# Patient Record
Sex: Male | Born: 1965 | Race: White | Hispanic: No | Marital: Single | Smoking: Never smoker
Health system: Southern US, Community
[De-identification: ages and names within clinical notes are randomized; demographics above are authoritative.]

## PROBLEM LIST (undated history)

## (undated) DIAGNOSIS — F419 Anxiety disorder, unspecified: Secondary | ICD-10-CM

## (undated) DIAGNOSIS — Q909 Down syndrome, unspecified: Secondary | ICD-10-CM

## (undated) DIAGNOSIS — M21612 Bunion of left foot: Secondary | ICD-10-CM

## (undated) HISTORY — PX: TOE SURGERY: SHX1073

## (undated) HISTORY — DX: Down syndrome, unspecified: Q90.9

---

## 1998-10-06 ENCOUNTER — Emergency Department (HOSPITAL_COMMUNITY): Admission: EM | Admit: 1998-10-06 | Discharge: 1998-10-07 | Payer: Self-pay | Admitting: Emergency Medicine

## 2001-03-14 ENCOUNTER — Emergency Department (HOSPITAL_COMMUNITY): Admission: EM | Admit: 2001-03-14 | Discharge: 2001-03-14 | Payer: Self-pay | Admitting: Emergency Medicine

## 2001-03-14 ENCOUNTER — Encounter: Payer: Self-pay | Admitting: Emergency Medicine

## 2001-12-08 ENCOUNTER — Emergency Department (HOSPITAL_COMMUNITY): Admission: EM | Admit: 2001-12-08 | Discharge: 2001-12-08 | Payer: Self-pay | Admitting: Emergency Medicine

## 2011-09-17 ENCOUNTER — Ambulatory Visit (INDEPENDENT_AMBULATORY_CARE_PROVIDER_SITE_OTHER): Payer: Medicare Other | Admitting: Family Medicine

## 2011-09-17 VITALS — BP 106/68 | HR 76 | Temp 98.6°F | Resp 18 | Ht 64.0 in | Wt 132.0 lb

## 2011-09-17 DIAGNOSIS — L259 Unspecified contact dermatitis, unspecified cause: Secondary | ICD-10-CM

## 2011-09-17 DIAGNOSIS — L309 Dermatitis, unspecified: Secondary | ICD-10-CM

## 2011-09-17 DIAGNOSIS — E039 Hypothyroidism, unspecified: Secondary | ICD-10-CM

## 2011-09-17 LAB — TSH: TSH: 2.882 u[IU]/mL (ref 0.350–4.500)

## 2011-09-17 MED ORDER — LEVOTHYROXINE SODIUM 50 MCG PO TABS: 50.0000 ug | ORAL_TABLET | Freq: Every day | ORAL | Status: DC

## 2011-09-17 NOTE — Patient Instructions (Addendum)
Continue thyroid medication. Return in 3 months for me to recheck it.  Use OTC hydrocortisone cream on face once daily for a few days until redness, down.

## 2011-09-17 NOTE — Progress Notes (Signed)
Subjective: Patient is here because he is out of his thyroid. He stopped it because he ran out. He did not come in because he scared come to doctors because he gets stuck. He has Down syndrome. He functions fairly well. I  Review of systems fairly unremarkable he does have a rash on his face. Assessment 1 for constipation. He does have a queasy stomach. He is anxious a lot of the time. He continues to see the psychiatrist and is on medications for that.  Objective: 46 year old man in no acute distress. Face is red. Special in the cheeks, not typically the pattern of rosacea. It looks more like an eczema. His neck is supple without nodes thyromegaly. Chest clear. Heart regular without murmurs. No ankle edema. He can reflexes essentially absent.  Assessment: Hypothyroidism Down syndrome Anxiety  Plan: Refill his medication. Recheck labs in about or months. Thank you

## 2011-09-21 ENCOUNTER — Encounter: Payer: Self-pay | Admitting: Radiology

## 2012-02-16 ENCOUNTER — Ambulatory Visit (INDEPENDENT_AMBULATORY_CARE_PROVIDER_SITE_OTHER): Payer: Medicare Other | Admitting: Family Medicine

## 2012-02-16 VITALS — BP 93/59 | HR 67 | Temp 97.4°F | Resp 16 | Ht 64.0 in | Wt 121.0 lb

## 2012-02-16 DIAGNOSIS — Q909 Down syndrome, unspecified: Secondary | ICD-10-CM

## 2012-02-16 DIAGNOSIS — E7800 Pure hypercholesterolemia, unspecified: Secondary | ICD-10-CM

## 2012-02-16 DIAGNOSIS — H6120 Impacted cerumen, unspecified ear: Secondary | ICD-10-CM

## 2012-02-16 DIAGNOSIS — F419 Anxiety disorder, unspecified: Secondary | ICD-10-CM

## 2012-02-16 DIAGNOSIS — Z8639 Personal history of other endocrine, nutritional and metabolic disease: Secondary | ICD-10-CM

## 2012-02-16 DIAGNOSIS — E039 Hypothyroidism, unspecified: Secondary | ICD-10-CM

## 2012-02-16 DIAGNOSIS — F411 Generalized anxiety disorder: Secondary | ICD-10-CM

## 2012-02-16 NOTE — Patient Instructions (Signed)
Use debrox for a week or so before returning to work on the other ear.

## 2012-02-16 NOTE — Progress Notes (Signed)
Subjective: 47 rolled male in no major distress at this time. He is here for recheck with regard to his hypothyroidism and refill of his medication. He has a lot of dry skin eczema on his hands which doesn't seem to really bother him that much, but also on his face. He has a history of his ears requiring irrigation in the past, and is not been complaining of his ears right now. He has been healthy this where so far. He did get his flu shot. On review of systems HEENT was negative. He denies any cardiovascular or respiratory GI or GU symptoms.  Objective: Pleasant young man in no major distress. TMs are both occluded. Has large plugs of wax bilaterally. His throat is clear. Neck supple without significant nodes. Chest is clear to auscultation. Heart regular without murmurs. Abdomen soft without mass or tenderness.  Assessment: Hypothyroidism Down Syndrome Dry skin eczema Bilateral cerumen impaction  Plan: We'll try to see if we can get him cleaned out there.  Procedure: With great effort we were able to irrigate and curette a large plug of wax out of his left ear. He is able to hear from it now. However the right ear remains occluded, and the wax did not easily come loose. I decided to wait and have him use the proximal and try again at a future time.

## 2012-02-17 LAB — LIPID PANEL
Cholesterol: 161 mg/dL (ref 0–200)
HDL: 46 mg/dL (ref >39)
LDL Cholesterol: 85 mg/dL (ref 0–99)
Total CHOL/HDL Ratio: 3.5 Ratio
Triglycerides: 149 mg/dL (ref <150)
VLDL: 30 mg/dL (ref 0–40)

## 2012-02-17 LAB — COMPREHENSIVE METABOLIC PANEL
ALT: 29 U/L (ref 0–53)
AST: 21 U/L (ref 0–37)
Albumin: 4.4 g/dL (ref 3.5–5.2)
Alkaline Phosphatase: 73 U/L (ref 39–117)
BUN: 12 mg/dL (ref 6–23)
CO2: 31 mEq/L (ref 19–32)
Calcium: 9.6 mg/dL (ref 8.4–10.5)
Chloride: 105 mEq/L (ref 96–112)
Creat: 1.06 mg/dL (ref 0.50–1.35)
Glucose, Bld: 86 mg/dL (ref 70–99)
Potassium: 4.2 mEq/L (ref 3.5–5.3)
Sodium: 143 mEq/L (ref 135–145)
Total Bilirubin: 0.3 mg/dL (ref 0.3–1.2)
Total Protein: 6.8 g/dL (ref 6.0–8.3)

## 2012-02-17 LAB — TSH: TSH: 1.264 u[IU]/mL (ref 0.350–4.500)

## 2012-03-19 ENCOUNTER — Other Ambulatory Visit: Payer: Self-pay | Admitting: Family Medicine

## 2012-06-20 ENCOUNTER — Other Ambulatory Visit: Payer: Self-pay | Admitting: Family Medicine

## 2012-08-20 ENCOUNTER — Other Ambulatory Visit: Payer: Self-pay | Admitting: Family Medicine

## 2014-08-19 ENCOUNTER — Encounter (HOSPITAL_COMMUNITY): Payer: Self-pay | Admitting: Emergency Medicine

## 2014-08-19 ENCOUNTER — Emergency Department (INDEPENDENT_AMBULATORY_CARE_PROVIDER_SITE_OTHER): Payer: Medicare Other

## 2014-08-19 ENCOUNTER — Emergency Department (HOSPITAL_COMMUNITY)
Admission: EM | Admit: 2014-08-19 | Discharge: 2014-08-19 | Disposition: A | Discharge status: Home / Self Care | Payer: Medicare Other | Source: Home / Self Care | Attending: Family Medicine | Admitting: Family Medicine

## 2014-08-19 DIAGNOSIS — S63501A Unspecified sprain of right wrist, initial encounter: Secondary | ICD-10-CM

## 2014-08-19 DIAGNOSIS — M25531 Pain in right wrist: Secondary | ICD-10-CM

## 2014-08-19 NOTE — Discharge Instructions (Signed)
Joint Sprain A sprain is a tear or stretch in the ligaments that hold a joint together. Severe sprains may need as long as 3-6 weeks of immobilization and/or exercises to heal completely. Sprained joints should be rested and protected. If not, they can become unstable and prone to re-injury. Proper treatment can reduce your pain, shorten the period of disability, and reduce the risk of repeated injuries. TREATMENT   Rest and elevate the injured joint to reduce pain and swelling.  Apply ice packs to the injury for 20-30 minutes every 2-3 hours for the next 2-3 days.  Keep the injury wrapped in a compression bandage or splint as long as the joint is painful or as instructed by your caregiver.  Do not use the injured joint until it is completely healed to prevent re-injury and chronic instability. Follow the instructions of your caregiver.  Long-term sprain management may require exercises and/or treatment by a physical therapist. Taping or special braces may help stabilize the joint until it is completely better. SEEK MEDICAL CARE IF:   You develop increased pain or swelling of the joint.  You develop increasing redness and warmth of the joint.  You develop a fever.  It becomes stiff.  Your hand or foot gets cold or numb. Document Released: 02/27/2004 Document Revised: 04/13/2011 Document Reviewed: 02/06/2008 [facility] Patient Information 2015 [employer] [place]. This information is not intended to replace advice given to you by your health care provider. Make sure you discuss any questions you have with your health care provider.   No fractures are noted. Ice to area every hour x 2o minutes for the next 24 hours to help with swelling and pain. Use Tylenol or Motrin as needed for discomfort.

## 2014-08-19 NOTE — ED Provider Notes (Signed)
CSN: [id number]     Arrival date & time 08/19/14  1904 History   First MD Initiated Contact with Patient 08/19/14 1915     Chief Complaint  Patient presents with  . Wrist Injury   (Consider location/radiation/quality/duration/timing/severity/associated sxs/prior Treatment) HPI Comments: Mr. Whitney PostLogan is brought in by his brother today; who is patient's primary care giver. Sharlot GowdaGregg has DS. He fell yesterday while at his sister's home. He landed on the right wrist in a extended position. Pain along the right wrist and 2nd and 3rd MCP. Swelling is noted. Brother reports will not move.   Patient is a 49 y.o. male presenting with wrist injury. The history is provided by a caregiver. The history is limited by a developmental delay.  Wrist Injury   Past Medical History  Diagnosis Date  . Depression   . Down syndrome    Past Surgical History  Procedure Laterality Date  . Toe surgery     Family History  Problem Relation Age of Onset  . Hypothyroidism Mother   . Heart disease Father   . Liver disease Father   . Hypothyroidism Sister   . Depression Brother   . GER disease Brother   . Heart disease Maternal Grandmother   . Heart disease Maternal Grandfather    History  Substance Use Topics  . Smoking status: Never Smoker   . Smokeless tobacco: Not on file  . Alcohol Use: No    Review of Systems  All other systems reviewed and are negative.   Allergies  Vicodin  Home Medications   Prior to Admission medications   Medication Sig Start Date End Date Taking? Authorizing Provider  ALPRAZolam Prudy Feeler(XANAX) 0.5 MG tablet Take 0.5 mg by mouth at bedtime as needed.    Historical Provider, MD  desvenlafaxine (PRISTIQ) 100 MG 24 hr tablet Take 100 mg by mouth daily.    Historical Provider, MD  levothyroxine (SYNTHROID, LEVOTHROID) 50 MCG tablet TAKE 1 TABLET BY MOUTH EVERY DAY 08/20/12   Eleanore E Egan, PA-C   BP 155/79 mmHg  Pulse 61  Temp(Src) 98.2 F (36.8 C) (Oral)  Resp 20  SpO2  100% Physical Exam  Constitutional: He appears well-developed and well-nourished. No distress.  Musculoskeletal:  Guarding to right wrist and hand. Difficult to fully asses.  Swelling in the right wrist and along the right 2nd and 3rd MCP; tenderness to any palpation. Refused to produce ROM. Pulses intact, good color and warm to touch.   Neurological: He is alert.  Skin: Skin is warm and dry. He is not diaphoretic.  Psychiatric: His behavior is normal.  Nursing note and vitals reviewed.   ED Course  Procedures (including critical care time) Labs Review Labs Reviewed - No data to display  Imaging Review Dg Wrist Complete Right  08/19/2014   CLINICAL DATA:  49 year old male with fall and wrist pain  EXAM: RIGHT WRIST - COMPLETE 3+ VIEW; RIGHT HAND - COMPLETE 3+ VIEW  COMPARISON:  None.  FINDINGS: There is no evidence of fracture or dislocation. There is no evidence of arthropathy or other focal bone abnormality. Soft tissues are unremarkable.  IMPRESSION: No fracture.   Electronically Signed   By: Elgie CollardArash  Radparvar M.D.   On: 08/19/2014 20:07   Dg Hand Complete Right  08/19/2014   CLINICAL DATA:  49 year old male with fall and wrist pain  EXAM: RIGHT WRIST - COMPLETE 3+ VIEW; RIGHT HAND - COMPLETE 3+ VIEW  COMPARISON:  None.  FINDINGS: There is no evidence of fracture  or dislocation. There is no evidence of arthropathy or other focal bone abnormality. Soft tissues are unremarkable.  IMPRESSION: No fracture.   Electronically Signed   By: Elgie Collard M.D.   On: 08/19/2014 20:07     MDM   1. Wrist sprain, right, initial encounter   2. Wrist pain, acute, right    No fracture. Sprain and soft tissue swelling. Treat with brace x 2 weeks. Ice, NSAIDs and rest. If worsens will f/u with Ortho.    Riki Sheer, PA-C 08/19/14 2015

## 2014-08-19 NOTE — ED Notes (Signed)
Caregiver brings pt in b/c he fell yest and landed on concrete flooring inj his right wrist Sx include swelling and pain Pt has hx of depression and down syndrome Alert, no signs of acute distress.

## 2015-12-09 ENCOUNTER — Ambulatory Visit (INDEPENDENT_AMBULATORY_CARE_PROVIDER_SITE_OTHER): Payer: Medicare Other | Admitting: Family Medicine

## 2015-12-09 VITALS — BP 124/80 | HR 57 | Temp 97.7°F | Resp 16 | Ht 63.0 in | Wt 125.0 lb

## 2015-12-09 DIAGNOSIS — S0003XA Contusion of scalp, initial encounter: Secondary | ICD-10-CM | POA: Diagnosis not present

## 2015-12-09 DIAGNOSIS — G44319 Acute post-traumatic headache, not intractable: Secondary | ICD-10-CM | POA: Diagnosis not present

## 2015-12-09 DIAGNOSIS — W19XXXA Unspecified fall, initial encounter: Secondary | ICD-10-CM

## 2015-12-09 DIAGNOSIS — Y92009 Unspecified place in unspecified non-institutional (private) residence as the place of occurrence of the external cause: Principal | ICD-10-CM

## 2015-12-09 DIAGNOSIS — Y92099 Unspecified place in other non-institutional residence as the place of occurrence of the external cause: Secondary | ICD-10-CM | POA: Diagnosis not present

## 2015-12-09 NOTE — Progress Notes (Signed)
  Chief Complaint  Patient presents with  . head pain    x 2 days     HPI Patient is here with his brother who he lives with He tripped over his shoe and fell Wednesday 5 days ago He started complaining about head pain 2 days ago He was mostly complaining of headache in the right parietal area. Tylenol does not make it stop hurting He denies blacking out No nausea or vomiting He is eating and back to his normal routine.   Past Medical History:  Diagnosis Date  . Depression   . Down syndrome     Current Outpatient Prescriptions  Medication Sig Dispense Refill  . ALPRAZolam (XANAX) 0.5 MG tablet Take 0.5 mg by mouth at bedtime as needed.     No current facility-administered medications for this visit.     Allergies:  Allergies  Allergen Reactions  . Vicodin [Hydrocodone-Acetaminophen] Itching and Anxiety    Past Surgical History:  Procedure Laterality Date  . TOE SURGERY      Social History   Social History  . Marital status: Single    Spouse name: N/A  . Number of children: N/A  . Years of education: N/A   Social History Main Topics  . Smoking status: Never Smoker  . Smokeless tobacco: Never Used  . Alcohol use No  . Drug use: No  . Sexual activity: No   Other Topics Concern  . None   Social History Narrative  . None    ROS  Objective: Vitals:   12/09/15 1220  BP: 124/80  Pulse: (!) 57  Resp: 16  Temp: 97.7 F (36.5 C)  TempSrc: Oral  SpO2: 100%  Weight: 125 lb (56.7 kg)  Height: 5\' 3"  (1.6 m)    Physical Exam  Constitutional: He appears well-developed and well-nourished.  HENT:  Head: Atraumatic.  Right Ear: External ear normal.  Left Ear: External ear normal.  Tenderness to palpation of the scalp in the right parietal area No ecchymosis No laceration   Eyes: Conjunctivae and EOM are normal.  Pulmonary/Chest: Effort normal.  Neurological: He displays normal reflexes.  Strength 5/5 in all extremities    Assessment and  Plan Sharlot GowdaGregg was seen today for head pain.  Diagnoses and all orders for this visit:  Fall in home, initial encounter Acute post-traumatic headache, not intractable Contusion of scalp, initial encounter -  Advised to use motrin for pain -  Ice prn -  Offered reassurance    Faryn Sieg A Creta LevinStallings

## 2015-12-09 NOTE — Patient Instructions (Addendum)
     IF you received an x-ray today, you will receive an invoice from [facility]. Please contact [facility] at [phone] with questions or concerns regarding your invoice.   IF you received labwork today, you will receive an invoice from [facility]. Please contact [facility] at [phone] with questions or concerns regarding your invoice.   Our billing staff will not be able to assist you with questions regarding bills from these companies.  You will be contacted with the lab results as soon as they are available. The fastest way to get your results is to activate your My Chart account. Instructions are located on the last page of this paperwork. If you have not heard from us regarding the results in 2 weeks, please contact this office.     Facial or Scalp Contusion A facial or scalp contusion is a deep bruise on the face or head. Injuries to the face and head generally cause a lot of swelling, especially around the eyes. Contusions are the result of an injury that caused bleeding under the skin. The contusion may turn blue, purple, or yellow. Minor injuries will give you a painless contusion, but more severe contusions may stay painful and swollen for a few weeks.  CAUSES  A facial or scalp contusion is caused by a blunt injury or trauma to the face or head area.  SIGNS AND SYMPTOMS   Swelling of the injured area.   Discoloration of the injured area.   Tenderness, soreness, or pain in the injured area.  DIAGNOSIS  The diagnosis can be made by taking a medical history and doing a physical exam. An X-ray exam, CT scan, or MRI may be needed to determine if there are any associated injuries, such as broken bones (fractures). TREATMENT  Often, the best treatment for a facial or scalp contusion is applying cold compresses to the injured area. Over-the-counter medicines may also be recommended for pain control.  HOME CARE INSTRUCTIONS    Only take over-the-counter or prescription medicines as directed by your health care provider.   Apply ice to the injured area.   Put ice in a plastic bag.   Place a towel between your skin and the bag.   Leave the ice on for 20 minutes, 2-3 times a day.  SEEK MEDICAL CARE IF:  You have bite problems.   You have pain with chewing.   You are concerned about facial defects. SEEK IMMEDIATE MEDICAL CARE IF:  You have severe pain or a headache that is not relieved by medicine.   You have unusual sleepiness, confusion, or personality changes.   You throw up (vomit).   You have a persistent nosebleed.   You have double vision or blurred vision.   You have fluid drainage from your nose or ear.   You have difficulty walking or using your arms or legs.  MAKE SURE YOU:   Understand these instructions.  Will watch your condition.  Will get help right away if you are not doing well or get worse.   This information is not intended to replace advice given to you by your health care provider. Make sure you discuss any questions you have with your health care provider.   Document Released: 02/27/2004 Document Revised: 02/09/2014 Document Reviewed: 09/01/2012 Elsevier Interactive Patient Education [employer]vier Inc.

## 2016-03-26 IMAGING — DX DG WRIST COMPLETE 3+V*R*
4 series · 4 of 4 positions shown · non-contrast
Comparison: None.

CLINICAL DATA: 49-year-old male with fall and wrist pain

EXAM:
RIGHT WRIST - COMPLETE 3+ VIEW; RIGHT HAND - COMPLETE 3+ VIEW

[wrist pa]
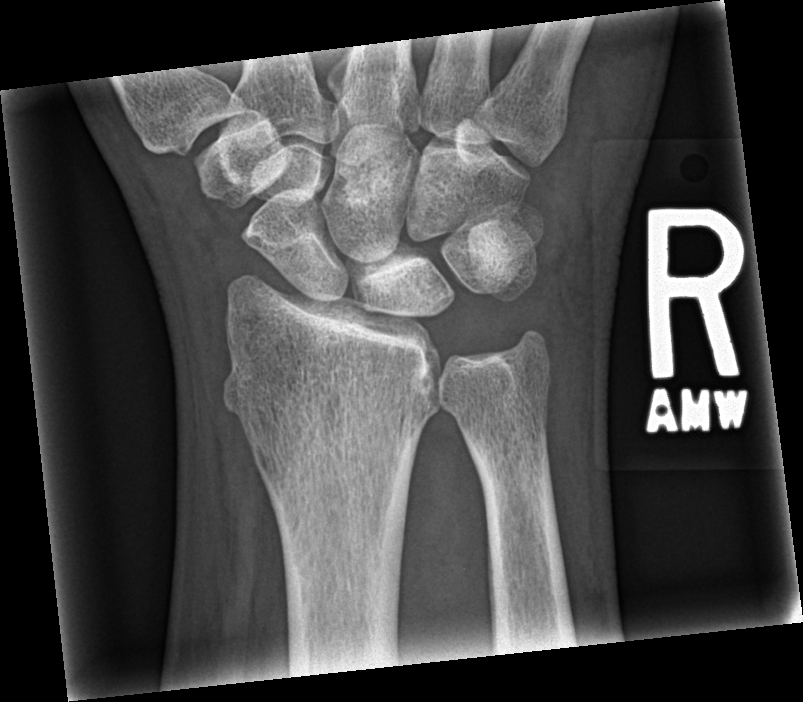

[wrist navicular]
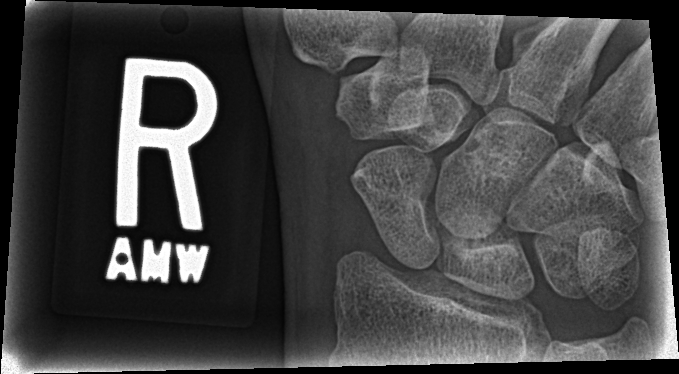

[wrist obl]
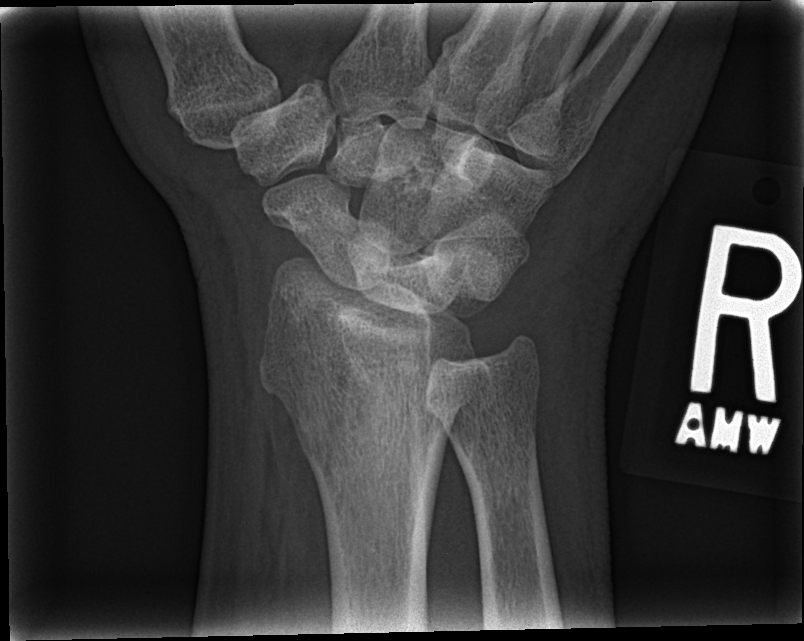

[wrist lat]
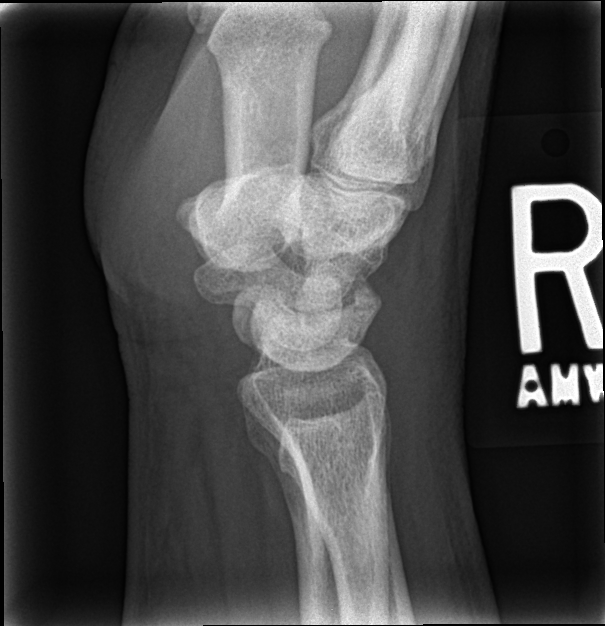

[4 of 4 positions shown; findings below may reference images not displayed]

FINDINGS: There is no evidence of fracture or dislocation. There is no
evidence of arthropathy or other focal bone abnormality. Soft
tissues are unremarkable.
IMPRESSION: No fracture.

## 2016-09-06 ENCOUNTER — Encounter (HOSPITAL_COMMUNITY): Payer: Self-pay | Admitting: Emergency Medicine

## 2016-09-06 ENCOUNTER — Ambulatory Visit (HOSPITAL_COMMUNITY)
Admission: EM | Admit: 2016-09-06 | Discharge: 2016-09-06 | Disposition: A | Discharge status: Home / Self Care | Payer: Medicare Other | Attending: Emergency Medicine | Admitting: Emergency Medicine

## 2016-09-06 DIAGNOSIS — K047 Periapical abscess without sinus: Secondary | ICD-10-CM | POA: Diagnosis not present

## 2016-09-06 MED ORDER — IBUPROFEN 800 MG PO TABS: 800.0000 mg | ORAL_TABLET | Freq: Three times a day (TID) | ORAL | 0 refills | Status: DC

## 2016-09-06 MED ORDER — CLINDAMYCIN HCL 300 MG PO CAPS: 300.0000 mg | ORAL_CAPSULE | Freq: Three times a day (TID) | ORAL | 0 refills | Status: DC

## 2016-09-06 MED ORDER — TRAMADOL HCL 50 MG PO TABS: 50.0000 mg | ORAL_TABLET | Freq: Four times a day (QID) | ORAL | 0 refills | Status: DC | PRN

## 2016-09-06 NOTE — ED Triage Notes (Signed)
[facility] a [facility] pain and facial swelling x 2 days.

## 2016-09-06 NOTE — ED Provider Notes (Signed)
  [facility]   [id number] 09/06/16 Arrival Time: 1442  ASSESSMENT & PLAN:  1. Dental abscess     Meds ordered this encounter  Medications  . clindamycin (CLEOCIN) 300 MG capsule    Sig: Take 1 capsule (300 mg total) by mouth 3 (three) times daily.    Dispense:  21 capsule    Refill:  0    Order Specific Question:   Supervising Provider    Answer:   Eustace MooreMURRAY, LAURA W [[id number]]  . ibuprofen (ADVIL,MOTRIN) 800 MG tablet    Sig: Take 1 tablet (800 mg total) by mouth 3 (three) times daily.    Dispense:  21 tablet    Refill:  0    Order Specific Question:   Supervising Provider    Answer:   Eustace MooreMURRAY, LAURA W [[id number]]  . traMADol (ULTRAM) 50 MG tablet    Sig: Take 1 tablet (50 mg total) by mouth every 6 (six) hours as needed.    Dispense:  15 tablet    Refill:  0    Order Specific Question:   Supervising Provider    Answer:   Eustace MooreMURRAY, LAURA W [[id number]]    [facility] controlled substances reporting system consulted prior to issuing prescription, no controlled substances were written in the last 6 months Reviewed expectations re: course of current medical issues. Questions answered. Outlined signs and symptoms indicating need for more acute intervention. Patient verbalized understanding. After Visit Summary given.   SUBJECTIVE:  Rivka SaferGregg M Regis is a 51 y.o. male who presents with complaint of Left sided facial swelling and pain. The pain that is worsened with chewing, hot and cold foods. Denies any fever, does have poor dentition, denies trismus or difficulty swallowing.   Denies any other complaints  ROS: As per HPI, otherwise negative.   OBJECTIVE:  Vitals:   09/06/16 1504  BP: 126/64  Pulse: 62  Resp: 16  Temp: 98.2 F (36.8 C)  TempSrc: Oral  SpO2: 100%     General appearance: alert; no distress HEENT: normocephalic; atraumatic; conjunctivae normal; TMs normal; nasal mucosa normal; oral mucosa normal, Dental abscess at the 15th tooth, no trismus Neck:  supple, no cervical lymphadenopathy Lungs: clear to auscultation bilaterally Heart: regular rate and rhythm Abdomen: soft, non-tender; bowel sounds normal; no masses or organomegaly; no guarding or rebound tenderness Back: no CVA tenderness Extremities: no cyanosis or edema; symmetrical with no gross deformities Skin: warm and dry Neurologic: Grossly normal Psychological:  alert and cooperative; normal mood and affect   Labs Reviewed - No data to display  No results found.  Allergies  Allergen Reactions  . Vicodin [Hydrocodone-Acetaminophen] Itching and Anxiety    PMHx, SurgHx, SocialHx, Medications, and Allergies were reviewed in the Visit Navigator and updated as appropriate.      Dorena BodoKennard, Tichina Koebel, NP 09/06/16 1544

## 2016-09-06 NOTE — Discharge Instructions (Signed)
Follow up with a dentist as soon as possible other wise symptoms will reoccur.

## 2016-09-09 ENCOUNTER — Telehealth: Payer: Self-pay | Admitting: Family Medicine

## 2016-09-09 NOTE — Telephone Encounter (Signed)
DR Luisa HartPATRICK REHM WOULD LIKE FOR ANY PROVIDER TO GIVE HIM A CALL AT [phone] TO DISCUSS PT HISTORY THE PT IS HAVING DENTAL SURGERY AND [facility] W[facility] LIKE [facility]ME INFORMATION ABOUT PT  I ADVISED DOCTOR THAT THE PT WAS ONLY SEEN ONCE LAST YEAR AND TWICE BY DR HOPPER THREE YEARS AGO HE WOULD STILL LIKE A CALL BACK

## 2016-09-09 NOTE — Telephone Encounter (Signed)
Can you address this? I know you only seen him once.

## 2016-09-10 NOTE — Telephone Encounter (Signed)
Called the office of the surgeon. Did not leave a voicemail.  They can fax a letter with what the need so I can have it documented in the chart.

## 2016-09-14 NOTE — Telephone Encounter (Signed)
Spoke with receptionist Morrie Sheldonshley and advised to fax over requested information needed.  Fax number provided.

## 2017-04-21 DIAGNOSIS — M7742 Metatarsalgia, left foot: Secondary | ICD-10-CM | POA: Diagnosis not present

## 2017-05-26 ENCOUNTER — Other Ambulatory Visit: Payer: Self-pay

## 2017-05-26 ENCOUNTER — Encounter: Payer: Self-pay | Admitting: Family Medicine

## 2017-05-26 ENCOUNTER — Ambulatory Visit (INDEPENDENT_AMBULATORY_CARE_PROVIDER_SITE_OTHER): Payer: Medicare Other | Admitting: Family Medicine

## 2017-05-26 VITALS — BP 110/68 | HR 65 | Temp 97.8°F | Resp 17 | Ht 63.0 in | Wt 126.4 lb

## 2017-05-26 DIAGNOSIS — Z01818 Encounter for other preprocedural examination: Secondary | ICD-10-CM | POA: Diagnosis not present

## 2017-05-26 LAB — POCT URINALYSIS DIP (MANUAL ENTRY)
BILIRUBIN UA: NEGATIVE
Blood, UA: NEGATIVE
Glucose, UA: NEGATIVE mg/dL
Ketones, POC UA: NEGATIVE mg/dL
LEUKOCYTES UA: NEGATIVE
NITRITE UA: NEGATIVE
PH UA: 6 (ref 5.0–8.0)
PROTEIN UA: NEGATIVE mg/dL
Spec Grav, UA: 1.02 (ref 1.010–1.025)
UROBILINOGEN UA: 0.2 U/dL

## 2017-05-26 LAB — POCT CBC
Granulocyte percent: 55.5 %G (ref 37–80)
HCT, POC: 45.9 % (ref 43.5–53.7)
HEMOGLOBIN: 15.3 g/dL (ref 14.1–18.1)
LYMPH, POC: 1.2 (ref 0.6–3.4)
MCH, POC: 31.5 pg — AB (ref 27–31.2)
MCHC: 33.3 g/dL (ref 31.8–35.4)
MCV: 94.6 fL (ref 80–97)
MID (cbc): 0.4 (ref 0–0.9)
MPV: 6.6 fL (ref 0–99.8)
POC Granulocyte: 1.9 — AB (ref 2–6.9)
POC LYMPH PERCENT: 33.6 %L (ref 10–50)
POC MID %: 10.9 % (ref 0–12)
Platelet Count, POC: 224 10*3/uL (ref 142–424)
RBC: 4.85 M/uL (ref 4.69–6.13)
WBC: 3.5 10*3/uL — AB (ref 4.6–10.2)

## 2017-05-26 NOTE — Patient Instructions (Signed)
     IF you received an x-ray today, you will receive an invoice from [facility]. Please contact [facility] at [phone] with questions or concerns regarding your invoice.   IF you received labwork today, you will receive an invoice from [facility]. Please contact [facility] at [phone] with questions or concerns regarding your invoice.   Our billing staff will not be able to assist you with questions regarding bills from these companies.  You will be contacted with the lab results as soon as they are available. The fastest way to get your results is to activate your My Chart account. Instructions are located on the last page of this paperwork. If you have not heard from us regarding the results in 2 weeks, please contact this office.     

## 2017-05-26 NOTE — Progress Notes (Signed)
Chief Complaint  Patient presents with  . surgical clearance for surgery    form faxed over from Dr Hewitt's office to dr Creta Levinstallings attention    HPI   Patient is here for surgical clearance so he can get podiatry surgery.   Past Medical History:  Diagnosis Date  . Depression   . Down syndrome     Current Outpatient Medications  Medication Sig Dispense Refill  . loratadine (CLARITIN) 10 MG tablet Take 10 mg by mouth daily.    Marland Kitchen. ALPRAZolam (XANAX) 0.5 MG tablet Take 0.5 mg by mouth at bedtime as needed.    . clindamycin (CLEOCIN) 300 MG capsule Take 1 capsule (300 mg total) by mouth 3 (three) times daily. (Patient not taking: Reported on 05/26/2017) 21 capsule 0  . ibuprofen (ADVIL,MOTRIN) 800 MG tablet Take 1 tablet (800 mg total) by mouth 3 (three) times daily. (Patient not taking: Reported on 05/26/2017) 21 tablet 0  . traMADol (ULTRAM) 50 MG tablet Take 1 tablet (50 mg total) by mouth every 6 (six) hours as needed. (Patient not taking: Reported on 05/26/2017) 15 tablet 0   No current facility-administered medications for this visit.     Allergies:  Allergies  Allergen Reactions  . Vicodin [Hydrocodone-Acetaminophen] Itching and Anxiety    Past Surgical History:  Procedure Laterality Date  . TOE SURGERY      Social History   Socioeconomic History  . Marital status: Single    Spouse name: Not on file  . Number of children: Not on file  . Years of education: Not on file  . Highest education level: Not on file  Occupational History  . Not on file  Social Needs  . Financial resource strain: Not on file  . Food insecurity:    Worry: Not on file    Inability: Not on file  . Transportation needs:    Medical: Not on file    Non-medical: Not on file  Tobacco Use  . Smoking status: Never Smoker  . Smokeless tobacco: Never Used  Substance and Sexual Activity  . Alcohol use: No  . Drug use: No  . Sexual activity: Never  Lifestyle  . Physical activity:    Days per  week: Not on file    Minutes per session: Not on file  . Stress: Not on file  Relationships  . Social connections:    Talks on phone: Not on file    Gets together: Not on file    Attends religious service: Not on file    Active member of club or organization: Not on file    Attends meetings of clubs or organizations: Not on file    Relationship status: Not on file  Other Topics Concern  . Not on file  Social History Narrative  . Not on file    Family History  Problem Relation Age of Onset  . Hypothyroidism Mother   . Heart disease Father   . Liver disease Father   . Hypothyroidism Sister   . Depression Brother   . GER disease Brother   . Heart disease Maternal Grandmother   . Heart disease Maternal Grandfather      Review of Systems  Constitutional: Negative for chills, fever and weight loss.  HENT: Negative for congestion and ear pain.   Eyes: Negative for blurred vision, double vision and photophobia.  Respiratory: Negative for cough, shortness of breath and wheezing.   Cardiovascular: Negative for chest pain and palpitations.  Gastrointestinal: Negative for abdominal pain,  nausea and vomiting.  Genitourinary: Negative for dysuria, frequency and urgency.  Musculoskeletal: Negative for back pain and neck pain.  Skin: Negative for itching and rash.  Neurological: Negative for dizziness, tingling and headaches.  Psychiatric/Behavioral: The patient is not nervous/anxious and does not have insomnia.       Objective: Vitals:   05/26/17 1339  BP: 110/68  Pulse: 65  Resp: 17  Temp: 97.8 F (36.6 C)  TempSrc: Oral  SpO2: 99%  Weight: 126 lb 6.4 oz (57.3 kg)  Height: 5\' 3"  (1.6 m)    Physical Exam  Constitutional: Vital signs are normal. He appears well-nourished.  HENT:  Head: Atraumatic. Microcephalic.  Right Ear: External ear normal.  Left Ear: External ear normal.  Mouth/Throat: Oropharynx is clear and moist.  Eyes: Pupils are equal, round, and reactive  to light. Conjunctivae and EOM are normal.  Cardiovascular: Normal rate, regular rhythm and normal heart sounds.  No murmur heard. Pulmonary/Chest: Effort normal and breath sounds normal. No stridor. No respiratory distress.  Neurological: He is alert.   EKG - NSR, no murmur  Assessment and Plan Paul Huffman was seen today for surgical clearance for surgery.  Diagnoses and all orders for this visit:  Preop examination -     EKG 12-Lead -     POCT CBC -     POCT urinalysis dipstick -     Basic metabolic panel   Cleared for surgery   Paul Huffman A Creta Levin

## 2017-05-27 LAB — BASIC METABOLIC PANEL WITH GFR
BUN/Creatinine Ratio: 13 (ref 9–20)
BUN: 15 mg/dL (ref 6–24)
CO2: 28 mmol/L (ref 20–29)
Calcium: 9.2 mg/dL (ref 8.7–10.2)
Chloride: 102 mmol/L (ref 96–106)
Creatinine, Ser: 1.15 mg/dL (ref 0.76–1.27)
GFR calc Af Amer: 85 mL/min/1.73
GFR calc non Af Amer: 73 mL/min/1.73
Glucose: 64 mg/dL — ABNORMAL LOW (ref 65–99)
Potassium: 4.8 mmol/L (ref 3.5–5.2)
Sodium: 143 mmol/L (ref 134–144)

## 2017-05-28 ENCOUNTER — Other Ambulatory Visit: Payer: Self-pay | Admitting: Orthopedic Surgery

## 2017-05-31 ENCOUNTER — Other Ambulatory Visit: Payer: Self-pay

## 2017-05-31 ENCOUNTER — Ambulatory Visit: Payer: Medicare Other | Admitting: Family Medicine

## 2017-05-31 ENCOUNTER — Encounter: Payer: Self-pay | Admitting: Family Medicine

## 2017-05-31 VITALS — BP 130/78 | HR 68 | Temp 98.4°F | Resp 17 | Ht 63.0 in | Wt 124.6 lb

## 2017-05-31 DIAGNOSIS — R6889 Other general symptoms and signs: Secondary | ICD-10-CM

## 2017-05-31 DIAGNOSIS — J029 Acute pharyngitis, unspecified: Secondary | ICD-10-CM | POA: Diagnosis not present

## 2017-05-31 LAB — POC INFLUENZA A&B (BINAX/QUICKVUE)
INFLUENZA B, POC: NEGATIVE
Influenza A, POC: NEGATIVE

## 2017-05-31 NOTE — Progress Notes (Signed)
Chief Complaint  Patient presents with  . possible flu    onset: Saturday, chest cong, st, rn, bodyaches, sweat, chills, temps running around 98    HPI  Pt with sore throat for the past 2 days  With nonproductive cough  Body aches Fevers chills and sweats Temps running 98 degrees No sick contacts No diarrhea or vomiting  Past Medical History:  Diagnosis Date  . Depression   . Down syndrome     Current Outpatient Medications  Medication Sig Dispense Refill  . ALPRAZolam (XANAX) 0.5 MG tablet Take 0.5 mg by mouth at bedtime as needed.    . clindamycin (CLEOCIN) 300 MG capsule Take 1 capsule (300 mg total) by mouth 3 (three) times daily. (Patient not taking: Reported on 05/26/2017) 21 capsule 0  . ibuprofen (ADVIL,MOTRIN) 800 MG tablet Take 1 tablet (800 mg total) by mouth 3 (three) times daily. (Patient not taking: Reported on 05/26/2017) 21 tablet 0  . loratadine (CLARITIN) 10 MG tablet Take 10 mg by mouth daily.    . traMADol (ULTRAM) 50 MG tablet Take 1 tablet (50 mg total) by mouth every 6 (six) hours as needed. (Patient not taking: Reported on 05/26/2017) 15 tablet 0   No current facility-administered medications for this visit.     Allergies:  Allergies  Allergen Reactions  . Vicodin [Hydrocodone-Acetaminophen] Itching and Anxiety    Past Surgical History:  Procedure Laterality Date  . TOE SURGERY      Social History   Socioeconomic History  . Marital status: Single    Spouse name: Not on file  . Number of children: Not on file  . Years of education: Not on file  . Highest education level: Not on file  Occupational History  . Not on file  Social Needs  . Financial resource strain: Not on file  . Food insecurity:    Worry: Not on file    Inability: Not on file  . Transportation needs:    Medical: Not on file    Non-medical: Not on file  Tobacco Use  . Smoking status: Never Smoker  . Smokeless tobacco: Never Used  Substance and Sexual Activity  .  Alcohol use: No  . Drug use: No  . Sexual activity: Never  Lifestyle  . Physical activity:    Days per week: Not on file    Minutes per session: Not on file  . Stress: Not on file  Relationships  . Social connections:    Talks on phone: Not on file    Gets together: Not on file    Attends religious service: Not on file    Active member of club or organization: Not on file    Attends meetings of clubs or organizations: Not on file    Relationship status: Not on file  Other Topics Concern  . Not on file  Social History Narrative  . Not on file    Family History  Problem Relation Age of Onset  . Hypothyroidism Mother   . Heart disease Father   . Liver disease Father   . Hypothyroidism Sister   . Depression Brother   . GER disease Brother   . Heart disease Maternal Grandmother   . Heart disease Maternal Grandfather      ROS Review of Systems See HPI Constitution: see hpi No malaise No diaphoresis Skin: No rash or itching Eyes: no blurry vision, no double vision GU: no dysuria or hematuria Neuro: no dizziness or headaches * all others reviewed  and negative   Objective: Vitals:   05/31/17 1602  BP: 130/78  Pulse: 68  Resp: 17  Temp: 98.4 F (36.9 C)  TempSrc: Oral  SpO2: 98%  Weight: 124 lb 9.6 oz (56.5 kg)  Height:  (1.6 m)    Physical Exam General: alert, oriented, in NAD Head: normocephalic, atraumatic, no sinus tenderness Eyes: EOM intact, no scleral icterus or conjunctival injection Ears: TM clear bilaterally Nose: mucosa nonerythematous, nonedematous Throat: no pharyngeal exudate, + pharyngeal erythema Lymph: no posterior auricular, submental or cervical lymph adenopathy Heart: normal rate, normal sinus rhythm, no murmurs Lungs: clear to auscultation bilaterally, no wheezing  Rapid flu negative   Assessment and Plan Tranquilino was seen today for possible flu.  Diagnoses and all orders for this visit:  Flu-like symptoms -     POC  Influenza A&B(BINAX/QUICKVUE)  Acute sore throat   Advised lozenges and tea Recipe provided Call for fevers or worsening symptoms   Saulo Anthis A Creta Levin

## 2017-05-31 NOTE — Patient Instructions (Addendum)
Tea recipe for sore throat: boil water, add 2 inches shaved ginger root, steep 15 minutes, add juice from 2 full lemons, and 2 tbsp honey.       IF you received an x-ray today, you will receive an invoice from [facility]. Please contact [facility] at [phone] with questions or concerns regarding your invoice.   IF you received labwork today, you will receive an invoice from [facility]. Please contact [facility] at [phone] with questions or concerns regarding your invoice.   Our billing staff will not be able to assist you with questions regarding bills from these companies.  You will be contacted with the lab results as soon as they are available. The fastest way to get your results is to activate your My Chart account. Instructions are located on the last page of this paperwork. If you have not heard from [place] regarding the results in 2 weeks, please contact this office.     Sore Throat A sore throat is pain, burning, irritation, or scratchiness in the throat. When you have a sore throat, you may feel pain or tenderness in your throat when you swallow or talk. Many things can cause a sore throat, including:  An infection.  Seasonal allergies.  Dryness in the air.  Irritants, such as smoke or pollution.  Gastroesophageal reflux disease (GERD).  A tumor.  A sore throat is often the first sign of another sickness. It may happen with other symptoms, such as coughing, sneezing, fever, and swollen neck glands. Most sore throats go away without medical treatment. Follow these instructions at home:  Take over-the-counter medicines only as told by your health care provider.  Drink enough fluids to keep your urine clear or pale yellow.  Rest as needed.  To help with pain, try: ? Sipping warm liquids, such as broth, herbal tea, or warm water. ? Eating or drinking cold or frozen liquids, such as frozen ice pops. ? Gargling with a salt-water mixture 3-4 times a  day or as needed. To make a salt-water mixture, completely dissolve -1 tsp of salt in 1 cup of warm water. ? Sucking on hard candy or throat lozenges. ? Putting a cool-mist humidifier in your bedroom at night to moisten the air. ? Sitting in the bathroom with the door closed for 5-10 minutes while you run hot water in the shower.  Do not use any tobacco products, such as cigarettes, chewing tobacco, and e-cigarettes. If you need help quitting, ask your health care provider. Contact a health care provider if:  You have a fever for more than 2-3 days.  You have symptoms that last (are persistent) for more than 2-3 days.  Your throat does not get better within 7 days.  You have a fever and your symptoms suddenly get worse. Get help right away if:  You have difficulty breathing.  You cannot swallow fluids, soft foods, or your saliva.  You have increased swelling in your throat or neck.  You have persistent nausea and vomiting. This information is not intended to replace advice given to you by your health care provider. Make sure you discuss any questions you have with your health care provider. Document Released: 02/27/2004 Document Revised: 09/15/2015 Document Reviewed: 11/09/2014 Elsevier Interactive Patient Education  [employer].

## 2017-06-18 ENCOUNTER — Encounter (HOSPITAL_BASED_OUTPATIENT_CLINIC_OR_DEPARTMENT_OTHER): Payer: Self-pay | Admitting: *Deleted

## 2017-06-21 ENCOUNTER — Other Ambulatory Visit: Payer: Self-pay

## 2017-06-21 ENCOUNTER — Encounter (HOSPITAL_BASED_OUTPATIENT_CLINIC_OR_DEPARTMENT_OTHER): Payer: Self-pay | Admitting: *Deleted

## 2017-06-24 ENCOUNTER — Encounter (HOSPITAL_BASED_OUTPATIENT_CLINIC_OR_DEPARTMENT_OTHER)
Admission: RE | Disposition: A | Discharge status: Home / Self Care | Payer: Self-pay | Source: Ambulatory Visit | Attending: Orthopedic Surgery

## 2017-06-24 ENCOUNTER — Ambulatory Visit (HOSPITAL_BASED_OUTPATIENT_CLINIC_OR_DEPARTMENT_OTHER): Payer: Medicare Other | Admitting: Certified Registered"

## 2017-06-24 ENCOUNTER — Ambulatory Visit (HOSPITAL_BASED_OUTPATIENT_CLINIC_OR_DEPARTMENT_OTHER)
Admission: RE | Admit: 2017-06-24 | Discharge: 2017-06-24 | Disposition: A | Discharge status: Home / Self Care | Payer: Medicare Other | Source: Ambulatory Visit | Attending: Orthopedic Surgery | Admitting: Orthopedic Surgery

## 2017-06-24 ENCOUNTER — Encounter (HOSPITAL_BASED_OUTPATIENT_CLINIC_OR_DEPARTMENT_OTHER): Payer: Self-pay | Admitting: Anesthesiology

## 2017-06-24 ENCOUNTER — Other Ambulatory Visit: Payer: Self-pay

## 2017-06-24 DIAGNOSIS — F419 Anxiety disorder, unspecified: Secondary | ICD-10-CM | POA: Diagnosis not present

## 2017-06-24 DIAGNOSIS — M21612 Bunion of left foot: Secondary | ICD-10-CM | POA: Diagnosis not present

## 2017-06-24 DIAGNOSIS — Z79899 Other long term (current) drug therapy: Secondary | ICD-10-CM | POA: Insufficient documentation

## 2017-06-24 DIAGNOSIS — M7742 Metatarsalgia, left foot: Secondary | ICD-10-CM | POA: Diagnosis not present

## 2017-06-24 DIAGNOSIS — M2012 Hallux valgus (acquired), left foot: Secondary | ICD-10-CM | POA: Diagnosis not present

## 2017-06-24 DIAGNOSIS — Q909 Down syndrome, unspecified: Secondary | ICD-10-CM | POA: Diagnosis not present

## 2017-06-24 HISTORY — PX: ARTHRODESIS METATARSAL: SHX6565

## 2017-06-24 HISTORY — PX: WEIL OSTEOTOMY: SHX5044

## 2017-06-24 HISTORY — DX: Bunion of left foot: M21.612

## 2017-06-24 HISTORY — DX: Anxiety disorder, unspecified: F41.9

## 2017-06-24 SURGERY — FUSION, JOINT, INVOLVING METATARSAL BONE
Anesthesia: General | Site: Foot | Laterality: Left

## 2017-06-24 MED ORDER — DEXAMETHASONE SODIUM PHOSPHATE 10 MG/ML IJ SOLN
INTRAMUSCULAR | Status: DC | PRN
  Administered 2017-06-24: 10 mg via INTRAVENOUS

## 2017-06-24 MED ORDER — BUPIVACAINE LIPOSOME 1.3 % IJ SUSP
INTRAMUSCULAR | Status: DC | PRN
  Administered 2017-06-24: 10 mL

## 2017-06-24 MED ORDER — LIDOCAINE HCL (CARDIAC) PF 100 MG/5ML IV SOSY
PREFILLED_SYRINGE | INTRAVENOUS | Status: AC
  Filled 2017-06-24: qty 5

## 2017-06-24 MED ORDER — SENNA 8.6 MG PO TABS: 2.0000 | ORAL_TABLET | Freq: Two times a day (BID) | ORAL | 0 refills | Status: DC

## 2017-06-24 MED ORDER — DOCUSATE SODIUM 100 MG PO CAPS: 100.0000 mg | ORAL_CAPSULE | Freq: Two times a day (BID) | ORAL | 0 refills | Status: DC

## 2017-06-24 MED ORDER — DEXAMETHASONE SODIUM PHOSPHATE 10 MG/ML IJ SOLN
INTRAMUSCULAR | Status: AC
  Filled 2017-06-24: qty 1

## 2017-06-24 MED ORDER — CEFAZOLIN SODIUM-DEXTROSE 2-4 GM/100ML-% IV SOLN
2.0000 g | INTRAVENOUS | Status: AC
  Administered 2017-06-24: 2 g via INTRAVENOUS

## 2017-06-24 MED ORDER — MIDAZOLAM HCL 2 MG/2ML IJ SOLN
INTRAMUSCULAR | Status: AC
  Filled 2017-06-24: qty 2

## 2017-06-24 MED ORDER — PROPOFOL 10 MG/ML IV BOLUS
INTRAVENOUS | Status: DC | PRN
  Administered 2017-06-24: 150 mg via INTRAVENOUS

## 2017-06-24 MED ORDER — ONDANSETRON HCL 4 MG/2ML IJ SOLN
INTRAMUSCULAR | Status: DC | PRN
  Administered 2017-06-24: 4 mg via INTRAVENOUS

## 2017-06-24 MED ORDER — LIDOCAINE 2% (20 MG/ML) 5 ML SYRINGE
INTRAMUSCULAR | Status: DC | PRN
  Administered 2017-06-24: 50 mg via INTRAVENOUS

## 2017-06-24 MED ORDER — MEPERIDINE HCL 25 MG/ML IJ SOLN: 6.2500 mg | INTRAMUSCULAR | Status: DC | PRN

## 2017-06-24 MED ORDER — FENTANYL CITRATE (PF) 100 MCG/2ML IJ SOLN
INTRAMUSCULAR | Status: AC
  Filled 2017-06-24: qty 2

## 2017-06-24 MED ORDER — FENTANYL CITRATE (PF) 100 MCG/2ML IJ SOLN
25.0000 ug | INTRAMUSCULAR | Status: DC | PRN
  Administered 2017-06-24 (×3): 25 ug via INTRAVENOUS

## 2017-06-24 MED ORDER — LACTATED RINGERS IV SOLN
INTRAVENOUS | Status: DC
  Administered 2017-06-24: 07:00:00 via INTRAVENOUS

## 2017-06-24 MED ORDER — BUPIVACAINE-EPINEPHRINE (PF) 0.5% -1:200000 IJ SOLN
INTRAMUSCULAR | Status: AC
  Filled 2017-06-24: qty 60

## 2017-06-24 MED ORDER — ONDANSETRON HCL 4 MG/2ML IJ SOLN: 4.0000 mg | Freq: Once | INTRAMUSCULAR | Status: DC | PRN

## 2017-06-24 MED ORDER — CHLORHEXIDINE GLUCONATE 4 % EX LIQD: 60.0000 mL | Freq: Once | CUTANEOUS | Status: DC

## 2017-06-24 MED ORDER — BUPIVACAINE-EPINEPHRINE (PF) 0.5% -1:200000 IJ SOLN
INTRAMUSCULAR | Status: AC
  Filled 2017-06-24: qty 30

## 2017-06-24 MED ORDER — HYDROCODONE-ACETAMINOPHEN 5-325 MG PO TABS: 1.0000 | ORAL_TABLET | Freq: Four times a day (QID) | ORAL | 0 refills | Status: AC | PRN

## 2017-06-24 MED ORDER — MIDAZOLAM HCL 2 MG/2ML IJ SOLN
1.0000 mg | INTRAMUSCULAR | Status: DC | PRN
  Administered 2017-06-24: 1 mg via INTRAVENOUS

## 2017-06-24 MED ORDER — FENTANYL CITRATE (PF) 100 MCG/2ML IJ SOLN
50.0000 ug | INTRAMUSCULAR | Status: DC | PRN
  Administered 2017-06-24: 50 ug via INTRAVENOUS

## 2017-06-24 MED ORDER — CEFAZOLIN SODIUM-DEXTROSE 2-4 GM/100ML-% IV SOLN
INTRAVENOUS | Status: AC
  Filled 2017-06-24: qty 100

## 2017-06-24 MED ORDER — ONDANSETRON HCL 4 MG/2ML IJ SOLN
INTRAMUSCULAR | Status: AC
  Filled 2017-06-24: qty 2

## 2017-06-24 MED ORDER — SCOPOLAMINE 1 MG/3DAYS TD PT72: 1.0000 | MEDICATED_PATCH | Freq: Once | TRANSDERMAL | Status: DC | PRN

## 2017-06-24 MED ORDER — SODIUM CHLORIDE 0.9 % IV SOLN: INTRAVENOUS | Status: DC

## 2017-06-24 SURGICAL SUPPLY — 84 items
BANDAGE ESMARK 6X9 LF (GAUZE/BANDAGES/DRESSINGS) IMPLANT
BIT DRILL 2.0 (BIT) ×1
BIT DRILL 2.0MM (BIT) ×1
BIT DRILL 2.9 CANN QC NONSTRL (BIT) ×3 IMPLANT
BIT DRILL 2XNS DISP SS SM FRAG (BIT) ×1 IMPLANT
BIT DRL 2XNS DISP SS SM FRAG (BIT) ×1
BLADE AVERAGE 25MMX9MM (BLADE) ×2
BLADE AVERAGE 25X9 (BLADE) ×4 IMPLANT
BLADE LONG MED 25X9 (BLADE) IMPLANT
BLADE LONG MED 25X9MM (BLADE)
BLADE MICRO SAGITTAL (BLADE) IMPLANT
BLADE OSC/SAG .038X5.5 CUT EDG (BLADE) IMPLANT
BLADE SURG 15 STRL LF DISP TIS (BLADE) ×2 IMPLANT
BLADE SURG 15 STRL SS (BLADE) ×4
BNDG COHESIVE 4X5 TAN STRL (GAUZE/BANDAGES/DRESSINGS) ×3 IMPLANT
BNDG COHESIVE 6X5 TAN STRL LF (GAUZE/BANDAGES/DRESSINGS) ×3 IMPLANT
BNDG CONFORM 3 STRL LF (GAUZE/BANDAGES/DRESSINGS) ×3 IMPLANT
BNDG ESMARK 6X9 LF (GAUZE/BANDAGES/DRESSINGS)
BOOT STEPPER DURA MED (SOFTGOODS) ×3 IMPLANT
CHLORAPREP W/TINT 26ML (MISCELLANEOUS) ×3 IMPLANT
COVER BACK TABLE 60X90IN (DRAPES) ×3 IMPLANT
CUFF TOURNIQUET SINGLE 24IN (TOURNIQUET CUFF) IMPLANT
CUFF TOURNIQUET SINGLE 34IN LL (TOURNIQUET CUFF) ×3 IMPLANT
DRAPE EXTREMITY T 121X128X90 (DRAPE) ×3 IMPLANT
DRAPE OEC MINIVIEW 54X84 (DRAPES) ×3 IMPLANT
DRAPE U-SHAPE 47X51 STRL (DRAPES) ×3 IMPLANT
DRSG MEPITEL 4X7.2 (GAUZE/BANDAGES/DRESSINGS) ×3 IMPLANT
DRSG PAD ABDOMINAL 8X10 ST (GAUZE/BANDAGES/DRESSINGS) ×6 IMPLANT
ELECT REM PT RETURN 9FT ADLT (ELECTROSURGICAL) ×3
ELECTRODE REM PT RTRN 9FT ADLT (ELECTROSURGICAL) ×1 IMPLANT
GAUZE SPONGE 4X4 12PLY STRL (GAUZE/BANDAGES/DRESSINGS) ×3 IMPLANT
GLOVE BIO SURGEON STRL SZ 6.5 (GLOVE) ×2 IMPLANT
GLOVE BIO SURGEON STRL SZ8 (GLOVE) ×3 IMPLANT
GLOVE BIO SURGEONS STRL SZ 6.5 (GLOVE) ×1
GLOVE BIOGEL PI IND STRL 7.0 (GLOVE) ×1 IMPLANT
GLOVE BIOGEL PI IND STRL 8 (GLOVE) ×3 IMPLANT
GLOVE BIOGEL PI INDICATOR 7.0 (GLOVE) ×2
GLOVE BIOGEL PI INDICATOR 8 (GLOVE) ×6
GLOVE ECLIPSE 8.0 STRL XLNG CF (GLOVE) ×3 IMPLANT
GOWN STRL REUS W/ TWL LRG LVL3 (GOWN DISPOSABLE) ×1 IMPLANT
GOWN STRL REUS W/ TWL XL LVL3 (GOWN DISPOSABLE) ×2 IMPLANT
GOWN STRL REUS W/TWL LRG LVL3 (GOWN DISPOSABLE) ×2
GOWN STRL REUS W/TWL XL LVL3 (GOWN DISPOSABLE) ×4
K-WIRE ACE 1.6X6 (WIRE) ×9
KWIRE ACE 1.6X6 (WIRE) ×3 IMPLANT
NEEDLE HYPO 22GX1.5 SAFETY (NEEDLE) IMPLANT
PACK BASIN DAY SURGERY FS (CUSTOM PROCEDURE TRAY) ×3 IMPLANT
PAD CAST 4YDX4 CTTN HI CHSV (CAST SUPPLIES) ×1 IMPLANT
PADDING CAST ABS 4INX4YD NS (CAST SUPPLIES)
PADDING CAST ABS COTTON 4X4 ST (CAST SUPPLIES) IMPLANT
PADDING CAST COTTON 4X4 STRL (CAST SUPPLIES) ×2
PADDING CAST COTTON 6X4 STRL (CAST SUPPLIES) ×3 IMPLANT
PENCIL BUTTON HOLSTER BLD 10FT (ELECTRODE) ×3 IMPLANT
PLATE SM 1/4 TUBULAR 6H (Plate) ×3 IMPLANT
SANITIZER HAND PURELL 535ML FO (MISCELLANEOUS) ×3 IMPLANT
SCREW CORTICAL 2.7MM  18MM (Screw) ×2 IMPLANT
SCREW CORTICAL 2.7MM  20MM (Screw) ×4 IMPLANT
SCREW CORTICAL 2.7MM 16MM (Screw) ×3 IMPLANT
SCREW CORTICAL 2.7MM 18MM (Screw) ×1 IMPLANT
SCREW CORTICAL 2.7MM 20MM (Screw) ×2 IMPLANT
SCREW HCS TWIST-OFF 2.0X14MM (Screw) ×3 IMPLANT
SCREW LAG  RD HEAD 4.0 50 LTH (Screw) ×2 IMPLANT
SCREW LAG RD HEAD 4.0 50 LTH (Screw) ×1 IMPLANT
SHEET MEDIUM DRAPE 40X70 STRL (DRAPES) ×3 IMPLANT
SLEEVE SCD COMPRESS KNEE MED (MISCELLANEOUS) ×3 IMPLANT
SPLINT FAST PLASTER 5X30 (CAST SUPPLIES) ×40
SPLINT PLASTER CAST FAST 5X30 (CAST SUPPLIES) ×20 IMPLANT
SPONGE LAP 18X18 RF (DISPOSABLE) ×3 IMPLANT
SPONGE SURGIFOAM ABS GEL 12-7 (HEMOSTASIS) IMPLANT
STOCKINETTE 6  STRL (DRAPES) ×2
STOCKINETTE 6 STRL (DRAPES) ×1 IMPLANT
SUCTION FRAZIER HANDLE 10FR (MISCELLANEOUS) ×2
SUCTION TUBE FRAZIER 10FR DISP (MISCELLANEOUS) ×1 IMPLANT
SUT ETHILON 3 0 PS 1 (SUTURE) ×3 IMPLANT
SUT MNCRL AB 3-0 PS2 18 (SUTURE) ×3 IMPLANT
SUT VIC AB 0 SH 27 (SUTURE) IMPLANT
SUT VIC AB 2-0 SH 27 (SUTURE) ×2
SUT VIC AB 2-0 SH 27XBRD (SUTURE) ×1 IMPLANT
SYR BULB 3OZ (MISCELLANEOUS) ×3 IMPLANT
SYR CONTROL 10ML LL (SYRINGE) IMPLANT
TOWEL OR 17X24 6PK STRL BLUE (TOWEL DISPOSABLE) ×6 IMPLANT
TUBE CONNECTING 20'X1/4 (TUBING) ×1
TUBE CONNECTING 20X1/4 (TUBING) ×2 IMPLANT
UNDERPAD 30X30 (UNDERPADS AND DIAPERS) ×3 IMPLANT

## 2017-06-24 NOTE — Anesthesia Postprocedure Evaluation (Signed)
Anesthesia Post Note  Patient: Paul Huffman  Procedure(s) Performed: Left hallux metatarsal phalangeal joint arthrodesis; 2nd metatarsal Weil osteotomy (Left Foot) WEIL OSTEOTOMY (Left Foot)     Patient location during evaluation: PACU Anesthesia Type: General Level of consciousness: awake and alert Pain management: pain level controlled Vital Signs Assessment: post-procedure vital signs reviewed and stable Respiratory status: spontaneous breathing, nonlabored ventilation and respiratory function stable Cardiovascular status: blood pressure returned to baseline and stable Postop Assessment: no apparent nausea or vomiting Anesthetic complications: no    Last Vitals:  Vitals:   06/24/17 0915 06/24/17 0930  BP: 127/81   Pulse: (!) 54 (!) 55  Resp: (!) 9 15  Temp:    SpO2: 100% 100%    Last Pain:  Vitals:   06/24/17 0930  TempSrc:   PainSc: 8                  Chavela Justiniano A.

## 2017-06-24 NOTE — Op Note (Signed)
06/24/2017  7:32 AM  PATIENT:  Paul Huffman  52 y.o. male  PRE-OPERATIVE DIAGNOSIS: 1.  Left severe bunion deformity 2.  Left forefoot metatarsalgia  POST-OPERATIVE DIAGNOSIS: Same  Procedure(s): 1.  Left hallux MP joint arthrodesis 2.  Left second metatarsal Weil osteotomy 3.  Left foot AP and lateral radiographs  SURGEON:  Toni Arthurs, MD  ASSISTANT: Alfredo Martinez, PA-C  ANESTHESIA:   General  EBL:  minimal   TOURNIQUET:  45 min at 250 mm Hg  COMPLICATIONS:  None apparent  DISPOSITION:  Extubated, awake and stable to recovery.  INDICATION FOR PROCEDURE: The patient is a 52 year old male with past medical history significant for Down syndrome.  He has had previous left bunion correction, but his bunion has recurred to a severe degree.  He also has severe second metatarsalgia with a plantar keratosis that is very painful.  He has failed nonoperative treatment to date including activity modification, oral anti-inflammatories and shoe wear modification.  He presents today for operative treatment of this painful condition.  He and his brother who is power of attorney understand the risks and benefits of the alternative treatment options and elects surgical treatment.  They specifically understand risks of bleeding, infection, nerve damage, blood clots, nonunion, chronic pain, amputation and death.  PROCEDURE IN DETAIL:  After pre operative consent was obtained, and the correct operative site was identified, the patient was brought to the operating room and placed supine on the OR table.  Anesthesia was administered.  Pre-operative antibiotics were administered.  A surgical timeout was taken.  The left lower externally was prepped and draped in standard sterile fashion with a tourniquet around the thigh.  The extremity was elevated and the tourniquet was inflated to 250 mmHg.  A longitudinal incision was made over the hallux MP joint.  Dissection was carried down through the subcutaneous  tissues.  The extensor tendons were protected.  The collateral ligaments were released exposing the metatarsal head.  A concave reamer was used to remove the remaining articular cartilage and subchondral bone.  The base of the proximal phalanx was then exposed and a convex reamer was used to remove the remaining articular cartilage and subchondral bone.  The joint was reduced and the hallux aligned appropriately.  A K wire was used to provisionally pinned the joint.  A 4 mm partially-threaded cannulated screw was inserted from proximal to distal and was noted to compress the arthrodesis site appropriately.  A 6-hole one quarter tubular plate from the [employer] mini frag set was used dorsally as a neutralization plate.  It was secured proximally and distally with 2 bicortical screws on each side of the joint.  AP and lateral radiographs confirmed appropriate alignment of the joint and appropriate position and length of the hardware.  The wound was then irrigated copiously.  The deep subtenons tissues were approximated with Vicryl.  Superficial subcutaneous tissues were closed with Monocryl.  Skin incision was closed with nylon.  Attention was then turned to the second metatarsal.  The previous incision was opened again sharply.  Dissection was carried down through the subtenons tissues.  There were no extensor tendons that could be identified.  Presumably these have been released and were scarred in.  The second metatarsal head was exposed.  A Weil osteotomy was then made with the oscillating saw removing a small wedge of bone distally.  The head was allowed to retract several millimeters proximally.  The osteotomy was fixed with a 2 mm Biomet FRS screw.  Final AP and lateral radiographs confirmed appropriate shortening of the second metatarsal in appropriate position and length of the hardware.  Again noted was arthrodesis of the hallux MP joint.  The incision was irrigated and closed with Monocryl and nylon.   10 cc of Exparel were injected at the operative sites for postoperative pain control.  Sterile dressings were applied followed by a cam walker boot.  Tourniquet was released after application of the dressings.  The patient was awakened from anesthesia and transported to the recovery room in stable condition.  FOLLOW UP PLAN: The patient will be weightbearing on his heel in the cam boot.  Follow-up with me in the office in 2 weeks for suture removal.  RADIOGRAPHS: AP and lateral radiographs of the left foot are obtained intraoperatively.  These show interval arthrodesis of the hallux MP joint and second metatarsal Weil osteotomy.  No acute injuries are noted.    Alfredo Martinez PA-C was present and scrubbed for the duration of the operative case. His assistance was essential in positioning the patient, prepping and draping, gaining and maintaining exposure, performing the operation, closing and dressing the wounds and applying the splint.

## 2017-06-24 NOTE — H&P (Addendum)
Paul Huffman is an 52 y.o. male.   Chief Complaint: Left foot pain HPI: The patient is a 52 year old male with past medical history significant for Down syndrome.  He complains of left forefoot pain that is chronic.  He has a severe bunion deformity that has failed previous operative treatment.  He has severe second metatarsalgia.  He presents today for hallux MP joint arthrodesis and second metatarsal Weil osteotomy.  He is accompanied by his brother who is power of attorney.  Past Medical History:  Diagnosis Date  . Anxiety   . Bunion of great toe of left foot   . Down syndrome     Past Surgical History:  Procedure Laterality Date  . TOE SURGERY      Family History  Problem Relation Age of Onset  . Hypothyroidism Mother   . Heart disease Father   . Liver disease Father   . Hypothyroidism Sister   . Depression Brother   . GER disease Brother   . Heart disease Maternal Grandmother   . Heart disease Maternal Grandfather    Social History:  reports that he has never smoked. He has never used smokeless tobacco. He reports that he does not drink alcohol or use drugs.  Allergies:  Allergies  Allergen Reactions  . Vicodin [Hydrocodone-Acetaminophen] Itching and Anxiety    Medications Prior to Admission  Medication Sig Dispense Refill  . ALPRAZolam (XANAX) 0.5 MG tablet Take 0.5 mg by mouth at bedtime as needed.    . loratadine (CLARITIN) 10 MG tablet Take 10 mg by mouth daily.      No results found for this or any previous visit (from the past 48 hour(s)). No results found.  ROS no recent fever, chills, nausea, vomiting or changes in his appetite  Blood pressure 139/76, pulse (!) 53, temperature 97.9 F (36.6 C), temperature source Oral, resp. rate 18, height  (1.6 m), weight 58.5 kg (129 lb). Physical Exam  Well-nourished well-developed man with Down syndrome hand and facial features.  Extraocular motions are intact respirations are unlabored gait is antalgic to the  left.  The left foot has a severe bunion deformity and a plantar keratosis beneath the second metatarsal head.  No lymphadenopathy.  Pulses are palpable.  Sensibility to light touch is intact at the forefoot.  5 out of 5 strength in plantar flexion and dorsiflexion of the ankle.  Brisk capillary refill of the toes.  Assessment/Plan Left severe bunion deformity and metatarsalgia -to the operating room for left hallux MP joint arthrodesis and second metatarsal Weil osteotomy.  The risks and benefits of the alternative treatment options have been discussed in detail with the patient and his brother who is POA.  They wish to proceed with surgery and specifically understand risks of bleeding, infection, nerve damage, blood clots, need for additional surgery, amputation and death.   Toni Arthurs, MD 05-Jul-2017, 7:28 AM

## 2017-06-24 NOTE — Transfer of Care (Signed)
Immediate Anesthesia Transfer of Care Note  Patient: Paul Huffman  Procedure(s) Performed: Left hallux metatarsal phalangeal joint arthrodesis; 2nd metatarsal Weil osteotomy (Left Foot) WEIL OSTEOTOMY (Left Foot)  Patient Location: PACU  Anesthesia Type:General  Level of Consciousness: sedated  Airway & Oxygen Therapy: Patient Spontanous Breathing and Patient connected to face mask oxygen  Post-op Assessment: Report given to RN and Post -op Vital signs reviewed and stable  Post vital signs: Reviewed and stable  Last Vitals:  Vitals Value Taken Time  BP    Temp    Pulse 56 06/24/2017  8:49 AM  Resp 9 06/24/2017  8:49 AM  SpO2 100 % 06/24/2017  8:49 AM  Vitals shown include unvalidated device data.  Last Pain:  Vitals:   06/24/17 0709  TempSrc: Oral      Patients Stated Pain Goal: 0 (06/24/17 0709)  Complications: No apparent anesthesia complications

## 2017-06-24 NOTE — Anesthesia Preprocedure Evaluation (Signed)
Anesthesia Evaluation  Patient identified by MRN, date of birth, ID band Patient awake    Reviewed: Allergy & Precautions, NPO status , Patient's Chart, lab work & pertinent test results  Airway Mallampati: III  TM Distance: >3 FB Neck ROM: Full    Dental no notable dental hx. (+) Teeth Intact   Pulmonary neg pulmonary ROS,    Pulmonary exam normal breath sounds clear to auscultation       Cardiovascular negative cardio ROS Normal cardiovascular exam Rhythm:Regular Rate:Normal     Neuro/Psych Anxiety Down's syndrome    GI/Hepatic negative GI ROS, Neg liver ROS,   Endo/Other  negative endocrine ROS  Renal/GU negative Renal ROS  negative genitourinary   Musculoskeletal Recurrent bunion left great toe   Abdominal   Peds  Hematology negative hematology ROS (+)   Anesthesia Other Findings   Reproductive/Obstetrics                             Anesthesia Physical Anesthesia Plan  ASA: II  Anesthesia Plan: General   Post-op Pain Management:  Regional for Post-op pain   Induction: Intravenous  PONV Risk Score and Plan: 3 and Midazolam, Dexamethasone, Ondansetron and Treatment may vary due to age or medical condition  Airway Management Planned: LMA  Additional Equipment:   Intra-op Plan:   Post-operative Plan: Extubation in OR  Informed Consent: I have reviewed the patients History and Physical, chart, labs and discussed the procedure including the risks, benefits and alternatives for the proposed anesthesia with the patient or authorized representative who has indicated his/her understanding and acceptance.   Dental advisory given  Plan Discussed with: CRNA and Anesthesiologist  Anesthesia Plan Comments:         Anesthesia Quick Evaluation

## 2017-06-24 NOTE — Discharge Instructions (Addendum)
Toni Arthurs, MD [facility]  Please read the following information regarding your care after surgery.  Medications  You only need a prescription for the narcotic pain medicine (ex. oxycodone, Percocet, Norco).  All of the other medicines listed below are available over the counter. X Aleve 2 pills twice a day for the first 3 days after surgery. X hydrocodone as prescribed for severe pain  Narcotic pain medicine (ex. oxycodone, Percocet, Vicodin) will cause constipation.  To prevent this problem, take the following medicines while you are taking any pain medicine. X docusate sodium (Colace) 100 mg twice a day X senna (Senokot) 2 tablets twice a day  Weight Bearing X Bear weight only on your operated foot in the CAM boot.   Cast / Splint / Dressing X Keep your splint, cast or dressing clean and dry.  Dont put anything (coat hanger, pencil, etc) down inside of it.  If it gets damp, use a hair dryer on the cool setting to dry it.  If it gets soaked, call the office to schedule an appointment for a cast change.    After your dressing, cast or splint is removed; you may shower, but do not soak or scrub the wound.  Allow the water to run over it, and then gently pat it dry.  Swelling It is normal for you to have swelling where you had surgery.  To reduce swelling and pain, keep your toes above your nose for at least 3 days after surgery.  It may be necessary to keep your foot or leg elevated for several weeks.  If it hurts, it should be elevated.  Follow Up Call my office at [phone] when you are discharged from the hospital or surgery center to schedule an appointment to be seen two weeks after surgery.  Call my office at [phone] if you develop a fever >101.5 F, nausea, vomiting, bleeding from the surgical site or severe pain.      Post Anesthesia Home Care Instructions  Activity: Get plenty of rest for the remainder of the day. A responsible individual must stay  with you for 24 hours following the procedure.  For the next 24 hours, DO NOT: -Drive a car -Advertising copywriter -Drink alcoholic beverages -Take any medication unless instructed by your physician -Make any legal decisions or sign important papers.  Meals: Start with liquid foods such as gelatin or soup. Progress to regular foods as tolerated. Avoid greasy, spicy, heavy foods. If nausea and/or vomiting occur, drink only clear liquids until the nausea and/or vomiting subsides. Call your physician if vomiting continues.  Special Instructions/Symptoms: Your throat may feel dry or sore from the anesthesia or the breathing tube placed in your throat during surgery. If this causes discomfort, gargle with warm salt water. The discomfort should disappear within 24 hours.  If you had a scopolamine patch placed behind your ear for the management of post- operative nausea and/or vomiting:  1. The medication in the patch is effective for 72 hours, after which it should be removed.  Wrap patch in a tissue and discard in the trash. Wash hands thoroughly with soap and water. 2. You may remove the patch earlier than 72 hours if you experience unpleasant side effects which may include dry mouth, dizziness or visual disturbances. 3. Avoid touching the patch. Wash your hands with soap and water after contact with the patch.

## 2017-06-24 NOTE — Anesthesia Procedure Notes (Signed)
Procedure Name: LMA Insertion Performed by: Janelly Switalski, [place], CRNA Pre-anesthesia Checklist: Patient identified, Emergency Drugs available, Suction available and Patient being monitored Patient Re-evaluated:Patient Re-evaluated prior to induction Oxygen Delivery Method: Circle system utilized Preoxygenation: Pre-oxygenation with 100% oxygen Induction Type: IV induction Ventilation: Mask ventilation without difficulty LMA: LMA inserted LMA Size: 4.0 Number of attempts: 1 Airway Equipment and Method: Bite block Placement Confirmation: positive ETCO2 Tube secured with: Tape Dental Injury: Teeth and Oropharynx as per pre-operative assessment        

## 2017-06-25 ENCOUNTER — Encounter (HOSPITAL_BASED_OUTPATIENT_CLINIC_OR_DEPARTMENT_OTHER): Payer: Self-pay | Admitting: Orthopedic Surgery

## 2017-08-12 DIAGNOSIS — M79672 Pain in left foot: Secondary | ICD-10-CM | POA: Diagnosis not present

## 2018-05-31 ENCOUNTER — Telehealth: Payer: Medicare Other | Admitting: Physician Assistant

## 2018-05-31 DIAGNOSIS — R0602 Shortness of breath: Secondary | ICD-10-CM

## 2018-05-31 DIAGNOSIS — J302 Other seasonal allergic rhinitis: Secondary | ICD-10-CM

## 2018-05-31 NOTE — Progress Notes (Signed)
Based on what you shared with me, I feel your condition warrants further evaluation and I recommend that you be seen for a face to face office visit.     NOTE: If you entered your credit card information for this eVisit, you will not be charged. You may see a "hold" on your card for the $35 but that hold will drop off and you will not have a charge processed.  If you are having a true medical emergency please call 911.  If you need an urgent face to face visit, [place] has four urgent care centers for your convenience.    PLEASE NOTE: THE INSTACARE LOCATIONS AND URGENT CARE CLINICS DO NOT HAVE THE TESTING FOR CORONAVIRUS COVID19 AVAILABLE.  IF YOU FEEL YOU NEED THIS TEST YOU MUST GO TO A TRIAGE LOCATION AT ONE OF THE HOSPITAL EMERGENCY DEPARTMENTS ?  [email or web address] to reserve your spot online an avoid wait times  [facility] [address], [address] [place], [place] [address] Modified hours of operation: Monday-Friday, 10 AM to 6 PM  Saturday & Sunday 10 AM to 4 PM *Across the street from [employer]  [facility] (New Address!) [address], [address] [place], [place] [address] *Just off [address], across the road from [employer]* Modified hours of operation: Monday-Friday, 10 AM to 5 PM  Closed Saturday & Sunday   The following sites will take your insurance:  [facility]  [phone] Get Driving Directions Find a Provider at this Location  [address] [place], [place] [address] 10 am to 8 pm Monday-Friday 12 pm to 8 pm [facility]   [facility]  [phone] Get Driving Directions Find a Provider at this Location  [address] [place], [place] [address] 8 am to 8 pm Monday-Friday 9 am to 6 pm Saturday 11 am to 6 pm Sunday   [facility]  [phone] Get Driving Directions  [id number] [place], [place] [address] 8 am to 8 pm Monday-Friday 8 am to 4 pm Saturday-Sunday   Your e-visit answers were reviewed by a board certified advanced clinical practitioner to complete your personal care plan.  Thank you for using e-Visits.  ===View-only below this line===   ----- Message -----    From: Paul SaferGregg M Huffman    Sent: 05/31/2018  3:09 PM EDT      To: E-Visit Mailing List Subject: E-Visit Submission: Allergies  E-Visit Submission: Allergies --------------------------------  Question: What symptoms are you having? (Check all that apply) Answer:   Runny nose            Itching of the eyes, nose, and roof of mouth            Watery eyes  Question: Other: Answer:   Left eye is very red  Question: Do you have a fever or chills? Answer:   No  Question: Do you have a cough? Answer:   No  Question: Are you short of breath or wheezing? Answer:   Short of breath only  Question: Have you ever been diagnosed with asthma? Answer:   No  Question: When did your symptoms start? Answer:   1-5 days ago  Question: Have you tried any over the counter medications? Answer:   Claritin allergy  Question: Over the counter medications "other": Answer:   Sudafed  Question: Have any of the over the counter medications been effective? Answer:  No  Question: Do you use a humidifier? Answer:   No  Question: Please list your medication allergies that you may have ? (If 'none' , please list as 'none') Answer:   None  Question: Please list any additional comments  Answer:

## 2018-06-01 ENCOUNTER — Telehealth (INDEPENDENT_AMBULATORY_CARE_PROVIDER_SITE_OTHER): Payer: Medicare Other | Admitting: Family Medicine

## 2018-06-01 ENCOUNTER — Telehealth: Payer: Self-pay | Admitting: Family Medicine

## 2018-06-01 ENCOUNTER — Other Ambulatory Visit: Payer: Self-pay

## 2018-06-01 DIAGNOSIS — H05012 Cellulitis of left orbit: Secondary | ICD-10-CM | POA: Diagnosis not present

## 2018-06-01 MED ORDER — SULFAMETHOXAZOLE-TRIMETHOPRIM 800-160 MG PO TABS: 1.0000 | ORAL_TABLET | Freq: Two times a day (BID) | ORAL | 0 refills | Status: DC

## 2018-06-01 MED ORDER — AMOXICILLIN-POT CLAVULANATE 875-125 MG PO TABS: 1.0000 | ORAL_TABLET | Freq: Two times a day (BID) | ORAL | 0 refills | Status: DC

## 2018-06-01 NOTE — Telephone Encounter (Signed)
Called pt about scheduling an appt per Mychart message

## 2018-06-01 NOTE — Progress Notes (Signed)
CC- left eye swollen,-Left eye is swollen, red and hurts a lot, watery/-clear for 2 days.  Sinus hurts no running nose. Patient's guardian will try to upload some picture of patients eye so we can take a look at it.

## 2018-06-01 NOTE — Patient Instructions (Signed)
° ° ° °  If you have lab work done today you will be contacted with your lab results within the next 2 weeks.  If you have not heard from us then please contact us. The fastest way to get your results is to register for My Chart. ° ° °IF you received an x-ray today, you will receive an invoice from [facility]. Please contact [facility] at [phone] with questions or concerns regarding your invoice.  ° °IF you received labwork today, you will receive an invoice from [facility]. Please contact [facility] at [phone] with questions or concerns regarding your invoice.  ° °Our billing staff will not be able to assist you with questions regarding bills from these companies. ° °You will be contacted with the lab results as soon as they are available. The fastest way to get your results is to activate your My Chart account. Instructions are located on the last page of this paperwork. If you have not heard from us regarding the results in 2 weeks, please contact this office. °  ° ° ° °

## 2018-06-01 NOTE — Progress Notes (Signed)
Telemedicine Encounter- SOAP NOTE Established Patient  I discussed the limitations, risks, security and privacy concerns of performing an evaluation and management service by telephone and the availability of in person appointments. I also discussed with the patient's brother that there may be a patient responsible charge related to this service. The brother expressed understanding and agreed to proceed.  This telephone encounter was conducted with the patient's brother verbal consent via audio telecommunications: yes. (brother is pts guardian-pt has Down's Syndrome.) Brother was with the pt at home- encounter was in a suitably private space; and to only have persons present to whom they give permission to participate. In addition, patient identity was confirmed by use of name plus two identifiers (DOB and address).  I spent a total of talking with the patient's brother.  CC- left eye swollen,-Left eye is swollen, red and hurts a lot per pt(brother is relating information), watery/-clear drainage for 2 days.  Sinus pressure above and below the left eye no runny nose. Patient's guardian was NOT able to upload some pictures of patients eye so we can take a look at it and he was not able to connect for video chat. Subjective   Paul Huffman is a 53 y.o. male established patient. Telephone visit today for eye pain/swollen Pts brother related the information concerning the pts condition since the pt has Down's Syndrome and per brother would have difficulty answering questions HPI Left eye swollen-upper lid-red and swollen, lower lid-red and swollen Injected-blood shot  Right eye NOT swollen-upper lid- NOT swollen-lower lid Not injected -blood shot-mild No drainage  nasal congestion-clear Allergies in the past-claritin -not currently working "Eye hurts on the inside" per pt Sinus pressure frontal, maxillary-left side Sinus problems in the past +FH sinus problems No gastro symptoms  Normal appetite  No SOB/ No cough Pt with no exposure outside home  Past Medical History:  Diagnosis Date  . Anxiety   . Bunion of great toe of left foot   . Down syndrome     Current Outpatient Medications  Medication Sig Dispense Refill  . loratadine (CLARITIN) 10 MG tablet Take 10 mg by mouth daily.    Marland Kitchen docusate sodium (COLACE) 100 MG capsule Take 1 capsule (100 mg total) by mouth 2 (two) times daily. While taking narcotic pain medicine. (Patient not taking: Reported on 06/01/2018) 30 capsule 0   No current facility-administered medications for this visit.     Allergies  Allergen Reactions  . Vicodin [Hydrocodone-Acetaminophen] Itching and Anxiety    Social History   Socioeconomic History  . Marital status: Single    Spouse name: Not on file  . Number of children: Not on file  . Years of education: Not on file  . Highest education level: Not on file  Occupational History  . Not on file  Social Needs  . Financial resource strain: Not on file  . Food insecurity:    Worry: Not on file    Inability: Not on file  . Transportation needs:    Medical: Not on file    Non-medical: Not on file  Tobacco Use  . Smoking status: Never Smoker  . Smokeless tobacco: Never Used  Substance and Sexual Activity  . Alcohol use: No  . Drug use: No  . Sexual activity: Never  Lifestyle  . Physical activity:    Days per week: Not on file    Minutes per session: Not on file  . Stress: Not on file  Relationships  .  Social connections:    Talks on phone: Not on file    Gets together: Not on file    Attends religious service: Not on file    Active member of club or organization: Not on file    Attends meetings of clubs or organizations: Not on file    Relationship status: Not on file  . Intimate partner violence:    Fear of current or ex partner: Not on file    Emotionally abused: Not on file    Physically abused: Not on file    Forced sexual activity: Not on file  Other  Topics Concern  . Not on file  Social History Narrative  . Not on file    ROS CONSTITUTIONAL: no  fever EENT: sinus problems, nasal congestion -left eye lid-upper and lower swollen CV: no chest pain RESP: no SOB, no cough, GI: no diarrhea  Cellulitis of orbit, left  Bactrim + augmentin-discussed with brother concerns for cellulitis-critical concern for diagnosis and need for improvement in symptoms or CT to determine if admission needed. Pt was not visualized on video-talked with brother about symptoms on telephone.  Brother understood need to start medication immediately and if no improvement 24-48 hours pt would need additional evaluation and treatment.  I discussed the assessment and treatment plan with the patient brother. The patient's brother  was provided an opportunity to ask questions and all were answered. The patient's brother t agreed with the plan and demonstrated an understanding of the instructions.   The patient was advised to call back or seek an in-person evaluation if the symptoms worsen or if the condition fails to improve as anticipated.  I provided 15minutes of non-face-to-face time during this encounter.  Sally Menard Mat CarneLEIGH Brylen Wagar, MD  [facility] 06-01-18

## 2018-06-24 ENCOUNTER — Telehealth: Payer: Self-pay | Admitting: Family Medicine

## 2018-06-24 NOTE — Telephone Encounter (Signed)
Copied from CRM [id number]. Topic: General - Inquiry >> Jun 24, 2018  1:44 PM Deborha Payment wrote: Reason for CRM: Patients guardian is calling stating his symptoms are back from Cellulitis of orbit. Patient was diagnosed with cellulitis.  Guardian would like a call back from PCP or nurse regarding this on the next steps. Guardian stated he was prescribed amoxicillin-clavulanate and it did clear up.  Starting last night 5/21 it did flare up again.  Guardian Call back #[phone]

## 2018-06-30 NOTE — Telephone Encounter (Signed)
Attempted to call back no answer so I left a general message to call back.  Please see message below and advise if there are any recommendations

## 2019-01-04 ENCOUNTER — Telehealth: Payer: Self-pay | Admitting: Family Medicine

## 2019-01-04 NOTE — Telephone Encounter (Signed)
Reason for CRM: Bre with [facility] has a question regarding them prescribing a medication for this patient for a dental procedure # 3 Bre [phone] option 4  Bree is calling back again regarding this request she wanted to let us know that her request  Is an urgent one. u her number is [phone] option 4    FR

## 2019-01-04 NOTE — Telephone Encounter (Signed)
Bree from [facility] calling to get ok for dentist to prescribe alprazolam .25 mg for pt as he is needing to have a crown performed and office is needing to make alprazolam ok to prescribe with pt health issues.  Per Nolon Rod ok to prescribe alprazolam 0.25 mg.  Verbal ok given to [facility] and appreciative of call back. Dgaddy, CMA

## 2019-01-04 NOTE — Telephone Encounter (Signed)
Copied from [place] [id number]. Topic: General - Inquiry >> Jan 04, 2019  9:19 AM Greggory Keen D wrote: Reason for CRM: Bre with [facility] has a question regarding them prescribing a medication for this patient for a dental procedure # [phone] option 4

## 2019-01-05 NOTE — Telephone Encounter (Signed)
Spoke with Bree and verbal ok given per stallings that prescribing alprazolam 0.25 mg ok to give to pt for crown procedure. Dgaddy, CMA

## 2019-02-20 ENCOUNTER — Encounter: Payer: Self-pay | Admitting: Family Medicine

## 2019-02-20 ENCOUNTER — Ambulatory Visit (INDEPENDENT_AMBULATORY_CARE_PROVIDER_SITE_OTHER): Payer: Medicare Other | Admitting: Family Medicine

## 2019-02-20 ENCOUNTER — Other Ambulatory Visit: Payer: Self-pay

## 2019-02-20 VITALS — BP 118/69 | HR 62 | Temp 98.3°F | Ht 64.0 in | Wt 129.6 lb

## 2019-02-20 DIAGNOSIS — Q909 Down syndrome, unspecified: Secondary | ICD-10-CM | POA: Diagnosis not present

## 2019-02-20 DIAGNOSIS — H1011 Acute atopic conjunctivitis, right eye: Secondary | ICD-10-CM

## 2019-02-20 MED ORDER — OFLOXACIN 0.3 % OP SOLN: OPHTHALMIC | 0 refills | Status: DC

## 2019-02-20 NOTE — Progress Notes (Signed)
Patient ID: YOLTZIN BARG, male    DOB: June 02, 1965  Age: 54 y.o. MRN: [id number]  Chief Complaint  Patient presents with  . right eye octular cellulitis    right eye hurts on the inside     Subjective:   54 year old young man whom I saw many years ago.  He has Down syndrome.  He has had several episodes of eye infections, diagnosed previously as a orbital cellulitis in the left eye.  He started getting some discomfort and drainage in the right eye yesterday.  No foreign body history.  He just woke up that way and is gradually gotten worse.  Not a lot of pain.  No vision distortion reported.  Currently during Covid he is just at home.  He has done some job looking last week but does not have a job yet.  He lost his last job at [employer]. Current allergies, medications, problem list, past/family and social histories reviewed.  Objective:  BP 118/69   Pulse 62   Temp 98.3 F (36.8 C) (Temporal)   Ht 5\' 4"  (1.626 m)   Wt 129 lb 9.6 oz (58.8 kg)   SpO2 98%   BMI 22.25 kg/m   No major acute distress.  Eyes PERRL.  Conjunctiva on the right eye is minimally injected compared to the left.  No tenderness or increased pressure to palpation.  No erythema of the orbit.  No preauricular or submandibular nodes.  His brother is his caregiver and is accompanying here here today.  Assessment & Plan:   Assessment: 1. Acute atopic conjunctivitis of right eye   2. Down syndrome       Plan: See instructions.  No orders of the defined types were placed in this encounter.   Meds ordered this encounter  Medications  . ofloxacin (OCUFLOX) 0.3 % ophthalmic solution    Sig: 1 or 2 drops in right eye 4 times daily    Dispense:  5 mL    Refill:  0         Patient Instructions    Use 1 or 2 drops in the right eye 4 times daily until it is doing better for a couple of days.  Actually if you hit 1 drop well that is all he needs.  Return or see an eye doctor if worse or further concerns at  any time.   If you have lab work done today you will be contacted with your lab results within the next 2 weeks.  If you have not heard from [place] then please contact us. The fastest way to get your results is to register for My Chart.   IF you received an x-ray today, you will receive an invoice from [facility]. Please contact [facility] at [phone] with questions or concerns regarding your invoice.   IF you received labwork today, you will receive an invoice from [facility]. Please contact [facility] at [phone] with questions or concerns regarding your invoice.   Our billing staff will not be able to assist you with questions regarding bills from these companies.  You will be contacted with the lab results as soon as they are available. The fastest way to get your results is to activate your My Chart account. Instructions are located on the last page of this paperwork. If you have not heard from [place] regarding the results in 2 weeks, please contact this office.         Return if symptoms worsen or fail to improve.  Janace Hoard, MD 02/20/2019

## 2019-02-20 NOTE — Patient Instructions (Addendum)
  Use 1 or 2 drops in the right eye 4 times daily until it is doing better for a couple of days.  Actually if you hit 1 drop well that is all he needs.  Return or see an eye doctor if worse or further concerns at any time.   If you have lab work done today you will be contacted with your lab results within the next 2 weeks.  If you have not heard from [place] then please contact us. The fastest way to get your results is to register for My Chart.   IF you received an x-ray today, you will receive an invoice from [facility]. Please contact [facility] at [phone] with questions or concerns regarding your invoice.   IF you received labwork today, you will receive an invoice from [facility]. Please contact [facility] at [phone] with questions or concerns regarding your invoice.   Our billing staff will not be able to assist you with questions regarding bills from these companies.  You will be contacted with the lab results as soon as they are available. The fastest way to get your results is to activate your My Chart account. Instructions are located on the last page of this paperwork. If you have not heard from [place] regarding the results in 2 weeks, please contact this office.

## 2020-02-29 ENCOUNTER — Telehealth: Payer: Self-pay | Admitting: Family Medicine

## 2020-02-29 NOTE — Telephone Encounter (Signed)
Dr Alwyn Ren was most recent provider this needs to be done and signed Friday!

## 2020-02-29 NOTE — Telephone Encounter (Addendum)
02/29/2020 - I RECEIVED A CALL FROM Paul Huffman WHO IS Paul Huffman'S BROTHER AND GUARDIAN. HE NEEDS A LETTER WRITTEN TO [facility] STATING THAT [name] HAS DOWNS SYNDROME WHICH IS A LIFE LONG DISABILITY THAT HE HAS HAD SINCE BEFORE THE AGE OF 23. HE HAS A JOB COACH AT [employer] WHO CAN HELP HIM FIND A JOB AND INVOLVE HIM IN MORE SERVICES THAT THEY HAVE TO OFFER. I WILL PUT THIS MESSAGE IN HIGH PRIORITY SINCE DR. HOPPER WILL BE IN THE OFFICE ON Friday 03/01/20. PLEASE MAKE THE LETTER TO THE ATTENTION OF [employer]. PLEASE CALL Paul WHEN IT CAN BE PICKED UP. BEST PHONE FOR Paul Araque IS: [phone]) MBC

## 2020-03-07 ENCOUNTER — Telehealth: Payer: Self-pay | Admitting: Family Medicine

## 2020-03-07 NOTE — Telephone Encounter (Signed)
Received call from patient's brother [name]. He wants to know if the letter for vocational rehab stating patient has lifelong disability has been written. Please advise at [phone]

## 2020-03-07 NOTE — Telephone Encounter (Signed)
Brother aware, will pick up tomorrow \

## 2020-03-15 ENCOUNTER — Other Ambulatory Visit: Payer: Self-pay

## 2020-03-15 ENCOUNTER — Ambulatory Visit (INDEPENDENT_AMBULATORY_CARE_PROVIDER_SITE_OTHER): Payer: Medicare Other | Admitting: Registered Nurse

## 2020-03-15 ENCOUNTER — Encounter: Payer: Self-pay | Admitting: Registered Nurse

## 2020-03-15 ENCOUNTER — Telehealth: Payer: Self-pay

## 2020-03-15 VITALS — BP 108/56 | HR 86 | Temp 98.4°F | Resp 18 | Ht 64.0 in | Wt 124.2 lb

## 2020-03-15 DIAGNOSIS — H1011 Acute atopic conjunctivitis, right eye: Secondary | ICD-10-CM

## 2020-03-15 MED ORDER — PREDNISOLONE SODIUM PHOSPHATE 1 % OP SOLN: 1.0000 [drp] | OPHTHALMIC | 0 refills | Status: DC

## 2020-03-15 MED ORDER — OLOPATADINE HCL 0.1 % OP SOLN: 1.0000 [drp] | Freq: Two times a day (BID) | OPHTHALMIC | 12 refills | Status: DC

## 2020-03-15 NOTE — Patient Instructions (Signed)
° ° ° °  If you have lab work done today you will be contacted with your lab results within the next 2 weeks.  If you have not heard from us then please contact us. The fastest way to get your results is to register for My Chart. ° ° °IF you received an x-ray today, you will receive an invoice from [facility]. Please contact [facility] at [phone] with questions or concerns regarding your invoice.  ° °IF you received labwork today, you will receive an invoice from [facility]. Please contact [facility] at [phone] with questions or concerns regarding your invoice.  ° °Our billing staff will not be able to assist you with questions regarding bills from these companies. ° °You will be contacted with the lab results as soon as they are available. The fastest way to get your results is to activate your My Chart account. Instructions are located on the last page of this paperwork. If you have not heard from us regarding the results in 2 weeks, please contact this office. °  ° ° ° °

## 2020-03-15 NOTE — Telephone Encounter (Signed)
Noted. We have called the provider and he stated that he will send in another eye drop for the pt.   Forward message to provider for reminder.

## 2020-03-15 NOTE — Telephone Encounter (Signed)
Pharmacist called on behalf of pt to inform the practice the medication (eye drops) prescribed by Mr. Paul Huffman were not covered under pt.'s insuarnce

## 2020-03-18 ENCOUNTER — Other Ambulatory Visit: Payer: Self-pay

## 2020-03-18 ENCOUNTER — Encounter: Payer: Self-pay | Admitting: Registered Nurse

## 2020-03-18 ENCOUNTER — Ambulatory Visit: Payer: Medicare Other | Admitting: Registered Nurse

## 2020-03-18 ENCOUNTER — Ambulatory Visit (INDEPENDENT_AMBULATORY_CARE_PROVIDER_SITE_OTHER): Payer: Medicare Other | Admitting: Registered Nurse

## 2020-03-18 VITALS — BP 109/65 | HR 56 | Temp 98.0°F | Resp 16 | Ht 64.0 in | Wt 124.0 lb

## 2020-03-18 DIAGNOSIS — L03213 Periorbital cellulitis: Secondary | ICD-10-CM | POA: Diagnosis not present

## 2020-03-18 MED ORDER — DOXYCYCLINE HYCLATE 100 MG PO TABS: 100.0000 mg | ORAL_TABLET | Freq: Two times a day (BID) | ORAL | 0 refills | Status: DC

## 2020-03-18 NOTE — Patient Instructions (Signed)
° ° ° °  If you have lab work done today you will be contacted with your lab results within the next 2 weeks.  If you have not heard from us then please contact us. The fastest way to get your results is to register for My Chart. ° ° °IF you received an x-ray today, you will receive an invoice from [facility]. Please contact [facility] at [phone] with questions or concerns regarding your invoice.  ° °IF you received labwork today, you will receive an invoice from [facility]. Please contact [facility] at [phone] with questions or concerns regarding your invoice.  ° °Our billing staff will not be able to assist you with questions regarding bills from these companies. ° °You will be contacted with the lab results as soon as they are available. The fastest way to get your results is to activate your My Chart account. Instructions are located on the last page of this paperwork. If you have not heard from us regarding the results in 2 weeks, please contact this office. °  ° ° ° °

## 2020-03-18 NOTE — Progress Notes (Signed)
Acute Office Visit  Subjective:    Patient ID: Paul Huffman, male    DOB: November 08, 1965, 55 y.o.   MRN: [id number]  Chief Complaint  Patient presents with  . Eye Pain    Left eye pain, started yesterday. Pain feels like it is inside eye.    HPI Patient is in today for ongoing eye pain  R eye resolved - had chemical exposure, used prednisolone and olopatadine eye drops with good effect. Redness and clear drainage resolved  Now L eye has pain in and around eye. Some local redness. No drainage. Does note that he is legally blind in that eye, so cannot determine if there are visual changes. No other headache or neuro changes.   Past Medical History:  Diagnosis Date  . Anxiety   . Bunion of great toe of left foot   . Down syndrome     Past Surgical History:  Procedure Laterality Date  . ARTHRODESIS METATARSAL Left 06/24/2017   Procedure: Left hallux metatarsal phalangeal joint arthrodesis; 2nd metatarsal Weil osteotomy;  Surgeon: Toni Arthurs, MD;  Location: [facility];  Service: Orthopedics;  Laterality: Left;  . TOE SURGERY    . WEIL OSTEOTOMY Left 06/24/2017   Procedure: WEIL OSTEOTOMY;  Surgeon: Toni Arthurs, MD;  Location: [facility];  Service: Orthopedics;  Laterality: Left;    Family History  Problem Relation Age of Onset  . Hypothyroidism Mother   . Heart disease Father   . Liver disease Father   . Hypothyroidism Sister   . Depression Brother   . GER disease Brother   . Heart disease Maternal Grandmother   . Heart disease Maternal Grandfather     Social History   Socioeconomic History  . Marital status: Single    Spouse name: Not on file  . Number of children: Not on file  . Years of education: Not on file  . Highest education level: Not on file  Occupational History  . Not on file  Tobacco Use  . Smoking status: Never Smoker  . Smokeless tobacco: Never Used  Substance and Sexual Activity  . Alcohol use: No  . Drug use: No   . Sexual activity: Never  Other Topics Concern  . Not on file  Social History Narrative  . Not on file   Social Determinants of Health   Financial Resource Strain: Not on file  Food Insecurity: Not on file  Transportation Needs: Not on file  Physical Activity: Not on file  Stress: Not on file  Social Connections: Not on file  Intimate Partner Violence: Not on file    Outpatient Medications Prior to Visit  Medication Sig Dispense Refill  . olopatadine (PATANOL) 0.1 % ophthalmic solution Place 1 drop into the right eye 2 (two) times daily. 5 mL 12  . prednisoLONE sodium phosphate (INFLAMASE FORTE) 1 % ophthalmic solution Place 1 drop into the right eye every 4 (four) hours while awake. 5 mL 0  . ofloxacin (OCUFLOX) 0.3 % ophthalmic solution 1 or 2 drops in right eye 4 times daily (Patient not taking: No sig reported) 5 mL 0   No facility-administered medications prior to visit.    Allergies  Allergen Reactions  . Vicodin [Hydrocodone-Acetaminophen] Itching and Anxiety    Review of Systems Per hpi      Objective:    Physical Exam Vitals and nursing note reviewed.  Constitutional:      Appearance: Normal appearance.  HENT:     Head:  Normocephalic and atraumatic.  Eyes:     General: Lids are normal. Lids are everted, no foreign bodies appreciated. Gaze aligned appropriately. No scleral icterus.       Right eye: No discharge.        Left eye: No discharge.     Extraocular Movements: Extraocular movements intact.     Conjunctiva/sclera: Conjunctivae normal.     Pupils: Pupils are equal, round, and reactive to light.     Comments: Unable to complete - pt could not keep eyes still.   Cardiovascular:     Rate and Rhythm: Normal rate and regular rhythm.     Pulses: Normal pulses.     Heart sounds: Normal heart sounds. No murmur heard. No friction rub. No gallop.   Pulmonary:     Effort: Pulmonary effort is normal. No respiratory distress.     Breath sounds: Normal  breath sounds. No stridor. No wheezing, rhonchi or rales.  Neurological:     General: No focal deficit present.     Mental Status: He is alert. Mental status is at baseline.  Psychiatric:        Mood and Affect: Mood normal.        Behavior: Behavior normal.        Thought Content: Thought content normal.        Judgment: Judgment normal.     BP 109/65   Pulse (!) 56   Temp 98 F (36.7 C) (Temporal)   Resp 16   Ht 5\' 4"  (1.626 m)   Wt 124 lb (56.2 kg)   SpO2 99%   BMI 21.28 kg/m  Wt Readings from Last 3 Encounters:  03/18/20 124 lb (56.2 kg)  03/15/20 124 lb 3.2 oz (56.3 kg)  02/20/19 129 lb 9.6 oz (58.8 kg)    Health Maintenance Due  Topic Date Due  . COLONOSCOPY (Pts 45-42yrs Insurance coverage will need to be confirmed)  Never done    There are no preventive care reminders to display for this patient.   Lab Results  Component Value Date   TSH 1.264 02/16/2012   Lab Results  Component Value Date   WBC 3.5 (A) 05/26/2017   HGB 15.3 05/26/2017   HCT 45.9 05/26/2017   MCV 94.6 05/26/2017   Lab Results  Component Value Date   NA 143 05/26/2017   K 4.8 05/26/2017   CO2 28 05/26/2017   GLUCOSE 64 (L) 05/26/2017   BUN 15 05/26/2017   CREATININE 1.15 05/26/2017   BILITOT 0.3 02/16/2012   ALKPHOS 73 02/16/2012   AST 21 02/16/2012   ALT 29 02/16/2012   PROT 6.8 02/16/2012   ALBUMIN 4.4 02/16/2012   CALCIUM 9.2 05/26/2017   Lab Results  Component Value Date   CHOL 161 02/16/2012   Lab Results  Component Value Date   HDL 46 02/16/2012   Lab Results  Component Value Date   LDLCALC 85 02/16/2012   Lab Results  Component Value Date   TRIG 149 02/16/2012   Lab Results  Component Value Date   CHOLHDL 3.5 02/16/2012   No results found for: HGBA1C     Assessment & Plan:   Problem List Items Addressed This Visit   None   Visit Diagnoses    Periorbital cellulitis of left eye    -  Primary   Relevant Medications   doxycycline (VIBRA-TABS) 100  MG tablet   Other Relevant Orders   Ambulatory referral to Ophthalmology  Meds ordered this encounter  Medications  . doxycycline (VIBRA-TABS) 100 MG tablet    Sig: Take 1 tablet (100 mg total) by mouth 2 (two) times daily.    Dispense:  14 tablet    Refill:  0    Order Specific Question:   Supervising Provider    Answer:   Neva Seat, JEFFREY R [[id number]]   PLAN  Suspect periorbital cellulitis  Doxycycline 100mg  PO bid for 7 days  Urgent referral to ophthalmology given inability to complete fundoscopic exam  Patient encouraged to call clinic with any questions, comments, or concerns.    , NP

## 2020-03-18 NOTE — Progress Notes (Signed)
Acute Office Visit  Subjective:    Patient ID: Paul Huffman, male    DOB: 10/27/1965, 55 y.o.   MRN: [id number]  Chief Complaint  Patient presents with  . Eye Pain    Patient states he has an headache yesterday and woke up with some eye irritation. He also states that some cleaning spray went into his eye.    HPI Patient is in today for R eye pain  Works at [employer] - had cleaning solution go into eye Red, swollen, painful, mildly blurred vision No drainage or crusting Did rinse eye out with water No hx of this Wears glasses at baseline  Here with his brother, his legal guardian  Past Medical History:  Diagnosis Date  . Anxiety   . Bunion of great toe of left foot   . Down syndrome     Past Surgical History:  Procedure Laterality Date  . ARTHRODESIS METATARSAL Left 06/24/2017   Procedure: Left hallux metatarsal phalangeal joint arthrodesis; 2nd metatarsal Weil osteotomy;  Surgeon: Toni Arthurs, MD;  Location: [facility];  Service: Orthopedics;  Laterality: Left;  . TOE SURGERY    . WEIL OSTEOTOMY Left 06/24/2017   Procedure: WEIL OSTEOTOMY;  Surgeon: Toni Arthurs, MD;  Location: [facility];  Service: Orthopedics;  Laterality: Left;    Family History  Problem Relation Age of Onset  . Hypothyroidism Mother   . Heart disease Father   . Liver disease Father   . Hypothyroidism Sister   . Depression Brother   . GER disease Brother   . Heart disease Maternal Grandmother   . Heart disease Maternal Grandfather     Social History   Socioeconomic History  . Marital status: Single    Spouse name: Not on file  . Number of children: Not on file  . Years of education: Not on file  . Highest education level: Not on file  Occupational History  . Not on file  Tobacco Use  . Smoking status: Never Smoker  . Smokeless tobacco: Never Used  Substance and Sexual Activity  . Alcohol use: No  . Drug use: No  . Sexual activity: Never  Other  Topics Concern  . Not on file  Social History Narrative  . Not on file   Social Determinants of Health   Financial Resource Strain: Not on file  Food Insecurity: Not on file  Transportation Needs: Not on file  Physical Activity: Not on file  Stress: Not on file  Social Connections: Not on file  Intimate Partner Violence: Not on file    Outpatient Medications Prior to Visit  Medication Sig Dispense Refill  . ofloxacin (OCUFLOX) 0.3 % ophthalmic solution 1 or 2 drops in right eye 4 times daily (Patient not taking: Reported on 03/15/2020) 5 mL 0   No facility-administered medications prior to visit.    Allergies  Allergen Reactions  . Vicodin [Hydrocodone-Acetaminophen] Itching and Anxiety    Review of Systems  Constitutional: Negative.   HENT: Negative.   Eyes: Negative.   Respiratory: Negative.   Cardiovascular: Negative.   Gastrointestinal: Negative.   Genitourinary: Negative.   Musculoskeletal: Negative.   Skin: Negative.   Neurological: Negative.   Psychiatric/Behavioral: Negative.   All other systems reviewed and are negative.      Objective:    Physical Exam Vitals and nursing note reviewed.  Constitutional:      Appearance: Normal appearance.  Eyes:     General: Lids are normal. Lids are  everted, no foreign bodies appreciated. Vision grossly intact. Gaze aligned appropriately.        Right eye: Discharge (clear) present.     Extraocular Movements: Extraocular movements intact.     Right eye: Normal extraocular motion and no nystagmus.     Conjunctiva/sclera:     Right eye: Right conjunctiva is injected. Chemosis present. No exudate or hemorrhage. Cardiovascular:     Rate and Rhythm: Normal rate and regular rhythm.     Pulses: Normal pulses.     Heart sounds: Normal heart sounds. No murmur heard. No friction rub. No gallop.   Pulmonary:     Effort: Pulmonary effort is normal. No respiratory distress.     Breath sounds: Normal breath sounds. No  stridor. No wheezing, rhonchi or rales.  Neurological:     General: No focal deficit present.     Mental Status: He is alert. Mental status is at baseline.  Psychiatric:        Mood and Affect: Mood normal.        Behavior: Behavior normal.        Thought Content: Thought content normal.        Judgment: Judgment normal.     BP (!) 108/56   Pulse 86   Temp 98.4 F (36.9 C) (Temporal)   Resp 18   Ht 5\' 4"  (1.626 m)   Wt 124 lb 3.2 oz (56.3 kg)   SpO2 96%   BMI 21.32 kg/m  Wt Readings from Last 3 Encounters:  03/15/20 124 lb 3.2 oz (56.3 kg)  02/20/19 129 lb 9.6 oz (58.8 kg)  06/24/17 129 lb (58.5 kg)    Health Maintenance Due  Topic Date Due  . COLONOSCOPY (Pts 45-13yrs Insurance coverage will need to be confirmed)  Never done    There are no preventive care reminders to display for this patient.   Lab Results  Component Value Date   TSH 1.264 02/16/2012   Lab Results  Component Value Date   WBC 3.5 (A) 05/26/2017   HGB 15.3 05/26/2017   HCT 45.9 05/26/2017   MCV 94.6 05/26/2017   Lab Results  Component Value Date   NA 143 05/26/2017   K 4.8 05/26/2017   CO2 28 05/26/2017   GLUCOSE 64 (L) 05/26/2017   BUN 15 05/26/2017   CREATININE 1.15 05/26/2017   BILITOT 0.3 02/16/2012   ALKPHOS 73 02/16/2012   AST 21 02/16/2012   ALT 29 02/16/2012   PROT 6.8 02/16/2012   ALBUMIN 4.4 02/16/2012   CALCIUM 9.2 05/26/2017   Lab Results  Component Value Date   CHOL 161 02/16/2012   Lab Results  Component Value Date   HDL 46 02/16/2012   Lab Results  Component Value Date   LDLCALC 85 02/16/2012   Lab Results  Component Value Date   TRIG 149 02/16/2012   Lab Results  Component Value Date   CHOLHDL 3.5 02/16/2012   No results found for: HGBA1C     Assessment & Plan:   Problem List Items Addressed This Visit   None   Visit Diagnoses    Acute atopic conjunctivitis of right eye    -  Primary   Relevant Medications   olopatadine (PATANOL) 0.1 %  ophthalmic solution   prednisoLONE sodium phosphate (INFLAMASE FORTE) 1 % ophthalmic solution   Other Relevant Orders   Ambulatory referral to Gastroenterology   Ambulatory referral to Ophthalmology       Meds ordered this encounter  Medications  .  olopatadine (PATANOL) 0.1 % ophthalmic solution    Sig: Place 1 drop into the right eye 2 (two) times daily.    Dispense:  5 mL    Refill:  12    Order Specific Question:   Supervising Provider    Answer:   Neva Seat, JEFFREY R [[id number]]  . prednisoLONE sodium phosphate (INFLAMASE FORTE) 1 % ophthalmic solution    Sig: Place 1 drop into the right eye every 4 (four) hours while awake.    Dispense:  5 mL    Refill:  0    Order Specific Question:   Supervising Provider    Answer:   Neva Seat, JEFFREY R [[id number]]   PLAN  inflamase and olopatadine eye drops  Return if worsening  Er precautions reviewed  Try to keep eye clean and shut when possible  Patient encouraged to call clinic with any questions, comments, or concerns.   Janeece Agee, NP

## 2020-03-19 ENCOUNTER — Telehealth: Payer: Self-pay | Admitting: Family Medicine

## 2020-03-19 NOTE — Telephone Encounter (Signed)
Left message on Scott's voicemail to inform him the referrals were faxed to Dr. Mateo Flow. Left Dr. Deirdre Evener address on the voicemail and advised [name] to call back if he has any additional questions or concerns.

## 2020-03-19 NOTE — Telephone Encounter (Signed)
Received call from [name] (patient's brother and guardian) to follow up on referral for retina specialist. Patient experiencing pain.   Note in chart under referral request states the following:  "This patient should be referred to a general ophthalmologist not a retinal specialist."  I read this note to [facility] and told him to listen out for a call. Please advise asap at [phone].

## 2020-09-14 LAB — COLOGUARD: Cologuard: NEGATIVE

## 2020-11-14 ENCOUNTER — Encounter: Payer: Self-pay | Admitting: Internal Medicine

## 2020-11-14 ENCOUNTER — Other Ambulatory Visit: Payer: Self-pay

## 2020-11-14 ENCOUNTER — Ambulatory Visit (INDEPENDENT_AMBULATORY_CARE_PROVIDER_SITE_OTHER): Payer: Medicare Other | Admitting: Internal Medicine

## 2020-11-14 VITALS — BP 126/72 | HR 64 | Temp 98.7°F | Ht 64.0 in | Wt 128.0 lb

## 2020-11-14 DIAGNOSIS — Q909 Down syndrome, unspecified: Secondary | ICD-10-CM | POA: Diagnosis not present

## 2020-11-14 DIAGNOSIS — L219 Seborrheic dermatitis, unspecified: Secondary | ICD-10-CM | POA: Diagnosis not present

## 2020-11-14 DIAGNOSIS — Z23 Encounter for immunization: Secondary | ICD-10-CM | POA: Insufficient documentation

## 2020-11-14 DIAGNOSIS — E785 Hyperlipidemia, unspecified: Secondary | ICD-10-CM

## 2020-11-14 DIAGNOSIS — N400 Enlarged prostate without lower urinary tract symptoms: Secondary | ICD-10-CM

## 2020-11-14 DIAGNOSIS — Z0001 Encounter for general adult medical examination with abnormal findings: Secondary | ICD-10-CM | POA: Diagnosis not present

## 2020-11-14 LAB — BASIC METABOLIC PANEL
BUN: 18 mg/dL (ref 6–23)
CO2: 35 mEq/L — ABNORMAL HIGH (ref 19–32)
Calcium: 9.7 mg/dL (ref 8.4–10.5)
Chloride: 102 mEq/L (ref 96–112)
Creatinine, Ser: 1.06 mg/dL (ref 0.40–1.50)
GFR: 79.43 mL/min (ref >60.00)
Glucose, Bld: 77 mg/dL (ref 70–99)
Potassium: 4.6 mEq/L (ref 3.5–5.1)
Sodium: 142 mEq/L (ref 135–145)

## 2020-11-14 LAB — URINALYSIS, ROUTINE W REFLEX MICROSCOPIC
Bilirubin Urine: NEGATIVE
Hgb urine dipstick: NEGATIVE
Leukocytes,Ua: NEGATIVE
Nitrite: NEGATIVE
RBC / HPF: NONE SEEN (ref 0–?)
Specific Gravity, Urine: 1.025 (ref 1.000–1.030)
Total Protein, Urine: NEGATIVE
Urine Glucose: NEGATIVE
Urobilinogen, UA: 1 (ref 0.0–1.0)
pH: 6 (ref 5.0–8.0)

## 2020-11-14 LAB — HEPATIC FUNCTION PANEL
ALT: 14 U/L (ref 0–53)
AST: 22 U/L (ref 0–37)
Albumin: 4.2 g/dL (ref 3.5–5.2)
Alkaline Phosphatase: 52 U/L (ref 39–117)
Bilirubin, Direct: 0.1 mg/dL (ref 0.0–0.3)
Total Bilirubin: 0.5 mg/dL (ref 0.2–1.2)
Total Protein: 6.9 g/dL (ref 6.0–8.3)

## 2020-11-14 LAB — LIPID PANEL
Cholesterol: 167 mg/dL (ref 0–200)
HDL: 53.9 mg/dL (ref >39.00)
LDL Cholesterol: 74 mg/dL (ref 0–99)
NonHDL: 113.27
Total CHOL/HDL Ratio: 3
Triglycerides: 197 mg/dL — ABNORMAL HIGH (ref 0.0–149.0)
VLDL: 39.4 mg/dL (ref 0.0–40.0)

## 2020-11-14 LAB — PSA: PSA: 0.3 ng/mL (ref 0.10–4.00)

## 2020-11-14 LAB — TSH: TSH: 5.49 u[IU]/mL (ref 0.35–5.50)

## 2020-11-14 MED ORDER — BOOSTRIX 5-2.5-18.5 LF-MCG/0.5 IM SUSP: 0.5000 mL | Freq: Once | INTRAMUSCULAR | 0 refills | Status: AC

## 2020-11-14 MED ORDER — SHINGRIX 50 MCG/0.5ML IM SUSR: 0.5000 mL | Freq: Once | INTRAMUSCULAR | 1 refills | Status: AC

## 2020-11-14 NOTE — Patient Instructions (Signed)
Seborrheic Dermatitis, Adult Seborrheic dermatitis is a skin disease that causes red, scaly patches. It usually occurs on the scalp, and it is often called dandruff. The patches may appear on other parts of the body. Skin patches tend to appear where there are many oil glands in the skin. Areas of the body that are commonly affected include the: Scalp. Ears. Eyebrows. Face. Bearded area of men's faces. Skin folds of the body, such as the armpits, groin, and buttocks. Chest. The condition may come and go for no known reason, and it is often long-lasting (chronic). What are the causes? The cause of this condition is not known. What increases the risk? The following factors may make you more likely to develop this condition: Having certain conditions, such as: HIV (human immunodeficiency virus). AIDS (acquired immunodeficiency syndrome). Parkinson's disease. Mood disorders, such as depression. Being 40-60 years old. What are the signs or symptoms? Symptoms of this condition include: Thick scales on the scalp. Redness on the face or in the armpits. Skin that is flaky. The flakes may be white or yellow. Skin that seems oily or dry but is not helped with moisturizers. Itching or burning in the affected areas. How is this diagnosed? This condition is diagnosed with a medical history and physical exam. A sample of your skin may be tested (skin biopsy). You may need to see a skin specialist (dermatologist). How is this treated? There is no cure for this condition, but treatment can help to manage the symptoms. You may get treatment to remove scales, lower the risk of skin infection, and reduce swelling or itching. Treatment may include: Creams that reduce skin yeast. Medicated shampoo. Moisturizing creams or ointments. Creams that reduce swelling and irritation (steroids). Follow these instructions at home: Apply over-the-counter and prescription medicines only as told by your health care  provider. Use any medicated shampoo, skin creams, or ointments only as told by your health care provider. Keep all follow-up visits as told by your health care provider. This is important. Contact a health care provider if: Your symptoms do not improve with treatment. Your symptoms get worse. You have new symptoms. Get help right away if: Your condition rapidly worsens with treatment. Summary Seborrheic dermatitis is a skin disease that causes red, scaly patches. Seborrheic dermatitis commonly affects the scalp, face, and skin folds. There is no cure for this condition, but treatment can help to manage the symptoms. This information is not intended to replace advice given to you by your health care provider. Make sure you discuss any questions you have with your health care provider. Document Revised: 10/27/2018 Document Reviewed: 10/27/2018 Elsevier Patient Education  2022 [employer]  

## 2020-11-14 NOTE — Progress Notes (Signed)
Subjective:  Patient ID: Paul Huffman, male    DOB: Jul 22, 1965  Age: 55 y.o. MRN: [id number]  CC: Annual Exam and Rash  This visit occurred during the SARS-CoV-2 public health emergency.  Safety protocols were in place, including screening questions prior to the visit, additional usage of staff PPE, and extensive cleaning of exam room while observing appropriate contact time as indicated for disinfecting solutions.    HPI [name] presents for a CPX and to establish.  He is with his brother today who is his guardian.  He has had a rash on his face for several years.  It causes itching and irritation but does not respond to over-the-counter topical steroids.  He otherwise feels well and offers no complaints.  His brother tells me that he submitted a Cologuard specimen 2 months ago and the results were negative.  History Paul Huffman has a past medical history of Anxiety, Bunion of great toe of left foot, and Down syndrome.   He has a past surgical history that includes Toe Surgery; Arthrodesis metatarsal (Left, 06/24/2017); and Weil osteotomy (Left, 06/24/2017).   His family history includes Depression in his brother; GER disease in his brother; Heart disease in his father, maternal grandfather, and maternal grandmother; Hypothyroidism in his mother and sister; Liver disease in his father.He reports that he has never smoked. He has never used smokeless tobacco. He reports that he does not drink alcohol and does not use drugs.  Outpatient Medications Prior to Visit  Medication Sig Dispense Refill   doxycycline (VIBRA-TABS) 100 MG tablet Take 1 tablet (100 mg total) by mouth 2 (two) times daily. 14 tablet 0   ofloxacin (OCUFLOX) 0.3 % ophthalmic solution 1 or 2 drops in right eye 4 times daily 5 mL 0   olopatadine (PATANOL) 0.1 % ophthalmic solution Place 1 drop into the right eye 2 (two) times daily. 5 mL 12   prednisoLONE sodium phosphate (INFLAMASE FORTE) 1 % ophthalmic solution Place 1  drop into the right eye every 4 (four) hours while awake. 5 mL 0   No facility-administered medications prior to visit.    ROS Review of Systems  Constitutional:  Negative for chills, diaphoresis, fatigue and fever.  HENT: Negative.    Eyes: Negative.   Respiratory:  Negative for cough, shortness of breath and wheezing.   Cardiovascular:  Negative for chest pain, palpitations and leg swelling.  Gastrointestinal:  Negative for abdominal pain, diarrhea, nausea and vomiting.  Endocrine: Negative.   Genitourinary: Negative.  Negative for scrotal swelling and testicular pain.  Musculoskeletal: Negative.   Skin:  Positive for rash. Negative for color change.  Neurological:  Negative for dizziness, weakness, light-headedness and headaches.  Hematological:  Negative for adenopathy. Does not bruise/bleed easily.  Psychiatric/Behavioral: Negative.     Objective:  BP 126/72 (BP Location: Left Arm, Patient Position: Sitting, Cuff Size: Large)   Pulse 64   Temp 98.7 F (37.1 C) (Oral)   Ht 5\' 4"  (1.626 m)   Wt 128 lb (58.1 kg)   SpO2 97%   BMI 21.97 kg/m   Physical Exam Vitals reviewed. Exam conducted with a chaperone present (His Brother (guardian)).  HENT:     Nose: Nose normal.     Mouth/Throat:     Mouth: Mucous membranes are moist.  Eyes:     Conjunctiva/sclera: Conjunctivae normal.  Cardiovascular:     Rate and Rhythm: Normal rate and regular rhythm.     Heart sounds: No murmur  heard. Pulmonary:     Effort: Pulmonary effort is normal.     Breath sounds: No stridor. No wheezing, rhonchi or rales.  Abdominal:     General: Abdomen is flat.     Palpations: There is no mass.     Tenderness: There is no abdominal tenderness. There is no guarding.     Hernia: There is no hernia in the left inguinal area or right inguinal area.  Genitourinary:    Penis: Normal and circumcised.      Testes: Normal.     Epididymis:     Right: Normal. Not inflamed or enlarged. No mass.     Left:  Normal. Not inflamed or enlarged. No mass.     Prostate: Enlarged. Not tender and no nodules present.     Rectum: Guaiac result negative. External hemorrhoid present. No mass, tenderness, anal fissure or internal hemorrhoid. Normal anal tone.  Musculoskeletal:        General: Normal range of motion.     Cervical back: Neck supple.     Right lower leg: No edema.     Left lower leg: No edema.  Lymphadenopathy:     Cervical: No cervical adenopathy.     Lower Body: No right inguinal adenopathy. No left inguinal adenopathy.  Skin:    Findings: Erythema and rash present.     Comments: There are patches of erythema and scaling involving the face, neck, and behind the ears.  There is involvement of the eyebrows and the nasolabial folds.  Some of these areas have a dense, greasy scale.  See photo.  Neurological:     General: No focal deficit present.     Mental Status: He is alert.  Psychiatric:        Mood and Affect: Mood normal.        Behavior: Behavior normal.    Lab Results  Component Value Date   WBC 3.5 (A) 05/26/2017   HGB 15.3 05/26/2017   HCT 45.9 05/26/2017   GLUCOSE 77 11/14/2020   CHOL 167 11/14/2020   TRIG 197.0 (H) 11/14/2020   HDL 53.90 11/14/2020   LDLCALC 74 11/14/2020   ALT 14 11/14/2020   AST 22 11/14/2020   NA 142 11/14/2020   K 4.6 11/14/2020   CL 102 11/14/2020   CREATININE 1.06 11/14/2020   BUN 18 11/14/2020   CO2 35 (H) 11/14/2020   TSH 5.49 11/14/2020   PSA 0.30 11/14/2020     Assessment & Plan:   Kevan was seen today for annual exam and rash.  Diagnoses and all orders for this visit:  Seborrheic dermatitis- He chooses not to use a topical agent.  Will treat with fluconazole. -     Basic metabolic panel; Future -     Basic metabolic panel -     Discontinue: itraconazole (SPORANOX) 100 MG capsule; Take 2 capsules (200 mg total) by mouth daily for 7 days. -     fluconazole (DIFLUCAN) 150 MG tablet; Take 2 tablets (300 mg total) by mouth once a  week.  Down's syndrome- No complications noted related to this. -     Basic metabolic panel; Future -     Basic metabolic panel  Hyperlipidemia LDL goal <160- He has a low ASCVD risk score.  Statin therapy is not indicated. -     Basic metabolic panel; Future -     Lipid panel; Future -     TSH; Future -     Hepatic function panel; Future -  Hepatic function panel -     TSH -     Lipid panel -     Basic metabolic panel  Benign prostatic hyperplasia without lower urinary tract symptoms- His PSA is low. -     Basic metabolic panel; Future -     PSA; Future -     Urinalysis, Routine w reflex microscopic; Future -     Urinalysis, Routine w reflex microscopic -     PSA -     Basic metabolic panel  Encounter for general adult medical examination with abnormal findings- Exam completed, labs reviewed, vaccines reviewed and updated, cancer screenings are up-to-date, patient education was given.  Need for prophylactic vaccination with combined diphtheria-tetanus-pertussis (DTP) vaccine -     Tdap (BOOSTRIX) 5-2.5-18.5 LF-MCG/0.5 injection; Inject 0.5 mLs into the muscle once for 1 dose.  Need for shingles vaccine -     Zoster Vaccine Adjuvanted [facility]) injection; Inject 0.5 mLs into the muscle once for 1 dose.  Other orders -     Flu Vaccine QUAD 6+ mos PF IM (Fluarix Quad PF)  I have discontinued Verlon Setting. Shima's ofloxacin, olopatadine, prednisoLONE sodium phosphate, doxycycline, and itraconazole. I am also having him start on Boostrix, Shingrix, and fluconazole.  Meds ordered this encounter  Medications   Tdap (BOOSTRIX) 5-2.5-18.5 LF-MCG/0.5 injection    Sig: Inject 0.5 mLs into the muscle once for 1 dose.    Dispense:  0.5 mL    Refill:  0   Zoster Vaccine Adjuvanted [facility]) injection    Sig: Inject 0.5 mLs into the muscle once for 1 dose.    Dispense:  0.5 mL    Refill:  1   DISCONTD: itraconazole (SPORANOX) 100 MG capsule    Sig: Take 2 capsules (200 mg total) by  mouth daily for 7 days.    Dispense:  14 capsule    Refill:  0   fluconazole (DIFLUCAN) 150 MG tablet    Sig: Take 2 tablets (300 mg total) by mouth once a week.    Dispense:  8 tablet    Refill:  0     Follow-up: Return in about 6 months (around 05/15/2021).  Sanda Linger, MD

## 2020-11-15 ENCOUNTER — Other Ambulatory Visit: Payer: Self-pay | Admitting: Internal Medicine

## 2020-11-15 DIAGNOSIS — L219 Seborrheic dermatitis, unspecified: Secondary | ICD-10-CM

## 2020-11-15 MED ORDER — ITRACONAZOLE 100 MG PO CAPS: 200.0000 mg | ORAL_CAPSULE | Freq: Every day | ORAL | 0 refills | Status: DC

## 2020-11-15 MED ORDER — FLUCONAZOLE 150 MG PO TABS: 300.0000 mg | ORAL_TABLET | ORAL | 0 refills | Status: AC

## 2021-12-15 ENCOUNTER — Telehealth: Payer: Self-pay | Admitting: Internal Medicine

## 2021-12-15 NOTE — Telephone Encounter (Signed)
No answer when calling to schedule AWV with [facility]

## 2022-01-21 ENCOUNTER — Telehealth: Payer: Medicare Other | Admitting: Physician Assistant

## 2022-01-21 DIAGNOSIS — B9689 Other specified bacterial agents as the cause of diseases classified elsewhere: Secondary | ICD-10-CM

## 2022-01-21 DIAGNOSIS — J208 Acute bronchitis due to other specified organisms: Secondary | ICD-10-CM

## 2022-01-21 MED ORDER — DOXYCYCLINE HYCLATE 100 MG PO TABS: 100.0000 mg | ORAL_TABLET | Freq: Two times a day (BID) | ORAL | 0 refills | Status: DC

## 2022-01-21 MED ORDER — BENZONATATE 100 MG PO CAPS: 100.0000 mg | ORAL_CAPSULE | Freq: Three times a day (TID) | ORAL | 0 refills | Status: DC | PRN

## 2022-01-21 NOTE — Progress Notes (Signed)
Virtual Visit Consent   Paul Huffman, you are scheduled for a virtual visit with a [facility] provider today. Just as with appointments in the office, your consent must be obtained to participate. Your consent will be active for this visit and any virtual visit you may have with one of our providers in the next 365 days. If you have a MyChart account, a copy of this consent can be sent to you electronically.  As this is a virtual visit, video technology does not allow for your provider to perform a traditional examination. This may limit your provider's ability to fully assess your condition. If your provider identifies any concerns that need to be evaluated in person or the need to arrange testing (such as labs, EKG, etc.), we will make arrangements to do so. Although advances in technology are sophisticated, we cannot ensure that it will always work on either your end or our end. If the connection with a video visit is poor, the visit may have to be switched to a telephone visit. With either a video or telephone visit, we are not always able to ensure that we have a secure connection.  By engaging in this virtual visit, you consent to the provision of healthcare and authorize for your insurance to be billed (if applicable) for the services provided during this visit. Depending on your insurance coverage, you may receive a charge related to this service.  I need to obtain your verbal consent now. Are you willing to proceed with your visit today? Paul Huffman and his brother/legal guardian Paul Huffman) have provided verbal consent on 01/21/2022 for a virtual visit (video or telephone). Piedad Climes, [place]  Date: 01/21/2022 2:04 PM  Virtual Visit via Video Note   I, Piedad Climes, connected with  Paul Huffman  ([id number], 1966/01/24) on 01/21/22 at  1:30 PM EST by a video-enabled telemedicine application and verified that I am speaking with the correct person using two  identifiers.  Location: Patient: Virtual Visit Location Patient: Home Provider: Virtual Visit Location Provider: Home Office   I discussed the limitations of evaluation and management by telemedicine and the availability of in person appointments. The patient expressed understanding and agreed to proceed.    History of Present Illness: Paul Huffman is a 56 y.o. who identifies as a male who was assigned male at birth, and is being seen today along with his legal guardian/primary caregiver/brother, Paul Huffman, for some recurring upper respiratory symptoms.  Guardian notes that Paul Huffman started at the beginning of last week with cough, chest and nasal congestion.  Was present for about 4 days or so before improving.  Was mostly resolved but then over this past week has began to worsen again.  Notes cough is mostly dry but can hear the significant chest congestion.  Denies noting any fever.  Paul Huffman has complained of some chest tenderness from coughing.  Also noting headache.  Guardian denies noting any difficulty breathing.  HPI: HPI  Problems:  Patient Active Problem List   Diagnosis Date Noted   Seborrheic dermatitis 11/14/2020   Down's syndrome 11/14/2020   Hyperlipidemia LDL goal <160 11/14/2020   Benign prostatic hyperplasia without lower urinary tract symptoms 11/14/2020   Encounter for general adult medical examination with abnormal findings 11/14/2020   Need for prophylactic vaccination with combined diphtheria-tetanus-pertussis (DTP) vaccine 11/14/2020    Allergies:  Allergies  Allergen Reactions   Vicodin [Hydrocodone-Acetaminophen] Itching and Anxiety   Medications: No current outpatient medications on file.  Observations/Objective: Patient is well-developed, well-nourished in no acute distress.  Resting comfortably at home.  Head is normocephalic, atraumatic.  No labored breathing. Speech is clear and coherent with logical content.  Patient is alert and oriented at baseline.    Assessment and Plan: 1. Acute bacterial bronchitis  Rx doxycycline.  Increase fluids.  Rest.  Saline nasal spray.  Probiotic.  Mucinex as directed.  Humidifier in bedroom.  Tessalon per orders.  Call or return to clinic if symptoms are not improving.   Follow Up Instructions: I discussed the assessment and treatment plan with the patient. The patient was provided an opportunity to ask questions and all were answered. The patient agreed with the plan and demonstrated an understanding of the instructions.  A copy of instructions were sent to the patient via MyChart unless otherwise noted below.   The patient was advised to call back or seek an in-person evaluation if the symptoms worsen or if the condition fails to improve as anticipated.  Time:  I spent 12 minutes with the patient via telehealth technology discussing the above problems/concerns.    Piedad Climes, PA-C

## 2022-01-21 NOTE — Patient Instructions (Signed)
[name], thank you for joining Piedad Climes, PA-C for today's virtual visit.  While this provider is not your primary care provider (PCP), if your PCP is located in our provider database this encounter information will be shared with them immediately following your visit.   A [facility] MyChart account gives you access to today's visit and all your visits, tests, and labs performed at [facility] " click here if you don't have a [facility] MyChart account or go to [email or web address]  Consent: (Patient) Paul Huffman provided verbal consent for this virtual visit at the beginning of the encounter.  Current Medications: No current outpatient medications on file.   Medications ordered in this encounter:  No orders of the defined types were placed in this encounter.    *If you need refills on other medications prior to your next appointment, please contact your pharmacy*  Follow-Up: Call back or seek an in-person evaluation if the symptoms worsen or if the condition fails to improve as anticipated.  [facility] [phone]  Other Instructions Take antibiotic (doxycycline) as directed.  Increase fluids.  Get plenty of rest. Use Mucinex for congestion.  Use the Tessalon as directed. Take a daily probiotic (I recommend Align or Culturelle, but even Activia Yogurt may be beneficial).  A humidifier placed in the bedroom may offer some relief for a dry, scratchy throat of nasal irritation.  Read information below on acute bronchitis. Please call or return to clinic if symptoms are not improving.  Acute Bronchitis Bronchitis is when the airways that extend from the windpipe into the lungs get red, puffy, and painful (inflamed). Bronchitis often causes thick spit (mucus) to develop. This leads to a cough. A cough is the most common symptom of bronchitis. In acute bronchitis, the condition usually begins suddenly and goes away over time (usually in 2  weeks). Smoking, allergies, and asthma can make bronchitis worse. Repeated episodes of bronchitis may cause more lung problems.  HOME CARE Rest. Drink enough fluids to keep your pee (urine) clear or pale yellow (unless you need to limit fluids as told by your doctor). Only take over-the-counter or prescription medicines as told by your doctor. Avoid smoking and secondhand smoke. These can make bronchitis worse. If you are a smoker, think about using nicotine gum or skin patches. Quitting smoking will help your lungs heal faster. Reduce the chance of getting bronchitis again by: Washing your hands often. Avoiding people with cold symptoms. Trying not to touch your hands to your mouth, nose, or eyes. Follow up with your doctor as told.  GET HELP IF: Your symptoms do not improve after 1 week of treatment. Symptoms include: Cough. Fever. Coughing up thick spit. Body aches. Chest congestion. Chills. Shortness of breath. Sore throat.  GET HELP RIGHT AWAY IF:  You have an increased fever. You have chills. You have severe shortness of breath. You have bloody thick spit (sputum). You throw up (vomit) often. You lose too much body fluid (dehydration). You have a severe headache. You faint.  MAKE SURE YOU:  Understand these instructions. Will watch your condition. Will get help right away if you are not doing well or get worse. Document Released: 07/08/2007 Document Revised: 09/21/2012 Document Reviewed: 07/12/2012 [facility] Patient Information 2015 [place], [place]. This information is not intended to replace advice given to you by your health care provider. Make sure you discuss any questions you have with your health care provider.    If you have been  instructed to have an in-person evaluation today at a local Urgent Care facility, please use the link below. It will take you to a list of all of our available [facility], including address, phone number and hours of  operation. Please do not delay care.  [facility]  If you or a family member do not have a primary care provider, use the link below to schedule a visit and establish care. When you choose a [facility] primary care physician or advanced practice provider, you gain a long-term partner in health. Find a Primary Care Provider  Learn more about [facility] in-office and virtual care options: [facility] - Get Care Now

## 2022-06-07 ENCOUNTER — Telehealth: Payer: Medicare Other | Admitting: Family Medicine

## 2022-06-07 DIAGNOSIS — J208 Acute bronchitis due to other specified organisms: Secondary | ICD-10-CM

## 2022-06-07 DIAGNOSIS — B9689 Other specified bacterial agents as the cause of diseases classified elsewhere: Secondary | ICD-10-CM

## 2022-06-07 MED ORDER — DOXYCYCLINE HYCLATE 100 MG PO TABS: 100.0000 mg | ORAL_TABLET | Freq: Two times a day (BID) | ORAL | 0 refills | Status: AC

## 2022-06-07 NOTE — Progress Notes (Signed)
Virtual Visit Consent   Paul Huffman, you are scheduled for a virtual visit with a [facility] provider today. Just as with appointments in the office, your consent must be obtained to participate. Your consent will be active for this visit and any virtual visit you may have with one of our providers in the next 365 days. If you have a MyChart account, a copy of this consent can be sent to you electronically.  As this is a virtual visit, video technology does not allow for your provider to perform a traditional examination. This may limit your provider's ability to fully assess your condition. If your provider identifies any concerns that need to be evaluated in person or the need to arrange testing (such as labs, EKG, etc.), we will make arrangements to do so. Although advances in technology are sophisticated, we cannot ensure that it will always work on either your end or our end. If the connection with a video visit is poor, the visit may have to be switched to a telephone visit. With either a video or telephone visit, we are not always able to ensure that we have a secure connection.  By engaging in this virtual visit, you consent to the provision of healthcare and authorize for your insurance to be billed (if applicable) for the services provided during this visit. Depending on your insurance coverage, you may receive a charge related to this service.  I need to obtain your verbal consent now. Are you willing to proceed with your visit today? Paul Huffman has provided verbal consent on 06/07/2022 for a virtual visit (video or telephone). Georgana Curio, FNP  Date: 06/07/2022 11:43 AM  Virtual Visit via Video Note   I, Georgana Curio, connected with  Paul Huffman  ([id number], July 25, 1965) on 06/07/22 at 11:45 AM EDT by a video-enabled telemedicine application and verified that I am speaking with the correct person using two identifiers.  Location: Patient: Virtual Visit Location Patient:  Home Provider: Virtual Visit Location Provider: Home Office   I discussed the limitations of evaluation and management by telemedicine and the availability of in person appointments. The patient expressed understanding and agreed to proceed.    History of Present Illness: Paul Huffman is a 58 y.o. who identifies as a male who was assigned male at birth, and is being seen today for left sided sinus pain and pressure. His brother [name] is on the video with him to assist due to [facility] syndrome.  He has left ear pain. He has drainage on the left side.No fever.  Marland Kitchen  HPI: HPI  Problems:  Patient Active Problem List   Diagnosis Date Noted   Seborrheic dermatitis 11/14/2020   Down's syndrome 11/14/2020   Hyperlipidemia LDL goal <160 11/14/2020   Benign prostatic hyperplasia without lower urinary tract symptoms 11/14/2020   Encounter for general adult medical examination with abnormal findings 11/14/2020   Need for prophylactic vaccination with combined diphtheria-tetanus-pertussis (DTP) vaccine 11/14/2020    Allergies:  Allergies  Allergen Reactions   Vicodin [Hydrocodone-Acetaminophen] Itching and Anxiety   Medications:  Current Outpatient Medications:    doxycycline (VIBRA-TABS) 100 MG tablet, Take 1 tablet (100 mg total) by mouth 2 (two) times daily for 10 days., Disp: 20 tablet, Rfl: 0   benzonatate (TESSALON) 100 MG capsule, Take 1 capsule (100 mg total) by mouth 3 (three) times daily as needed for cough., Disp: 30 capsule, Rfl: 0   doxycycline (VIBRA-TABS) 100 MG tablet, Take 1 tablet (100 mg  total) by mouth 2 (two) times daily., Disp: 14 tablet, Rfl: 0  Observations/Objective: Patient is well-developed, well-nourished in no acute distress.  Resting comfortably  at home.  Head is normocephalic, atraumatic.  No labored breathing.  Speech is clear and coherent with logical content.  Patient is alert and oriented at baseline.    Assessment and Plan: 1. Acute bacterial  bronchitis  Increase fluids, start flonase otc, may use claritin, UC if sx worsen.   Follow Up Instructions: I discussed the assessment and treatment plan with the patient. The patient was provided an opportunity to ask questions and all were answered. The patient agreed with the plan and demonstrated an understanding of the instructions.  A copy of instructions were sent to the patient via MyChart unless otherwise noted below.     The patient was advised to call back or seek an in-person evaluation if the symptoms worsen or if the condition fails to improve as anticipated.  Time:  I spent 10 minutes with the patient via telehealth technology discussing the above problems/concerns.    Georgana Curio, FNP

## 2022-06-07 NOTE — Patient Instructions (Signed)

## 2022-06-10 ENCOUNTER — Other Ambulatory Visit: Payer: Self-pay

## 2022-06-10 ENCOUNTER — Ambulatory Visit
Admission: RE | Admit: 2022-06-10 | Discharge: 2022-06-10 | Disposition: A | Discharge status: Home / Self Care | Payer: Medicare Other | Source: Ambulatory Visit | Attending: Nurse Practitioner | Admitting: Nurse Practitioner

## 2022-06-10 VITALS — BP 106/61 | HR 70 | Temp 98.4°F | Resp 16

## 2022-06-10 DIAGNOSIS — H6122 Impacted cerumen, left ear: Secondary | ICD-10-CM | POA: Diagnosis not present

## 2022-06-10 DIAGNOSIS — H6123 Impacted cerumen, bilateral: Secondary | ICD-10-CM

## 2022-06-10 NOTE — ED Provider Notes (Signed)
[facility]    CSN: [id number] Arrival date & time: 06/10/22  1238      History   Chief Complaint Chief Complaint  Patient presents with   Ear Fullness    Paul Huffman is being treated for a sinus infection, but is complaining about his left ear. I noticed a lot of wax. I used Debrox to flush it out, but it didn't appear to remove much of the wax.  Paul Huffman has Down Syndrome and his brother will be with him - Entered by patient    HPI Paul Huffman is a 57 y.o. male presents for evaluation of left ear pain.  Patient is accompanied by his guardian.  Guardian reports patient is currently being treated for sinus infection but over the past few days has been complaining of left ear pain.  Reports he does have a history of cerumen impaction.  They tried Debrox and to flush it out but did not see any improvement in symptoms.  He does use Q-tips.  No fevers or chills.  No other concerns at this time.   Ear Fullness    Past Medical History:  Diagnosis Date   Anxiety    Bunion of great toe of left foot    Down syndrome     Patient Active Problem List   Diagnosis Date Noted   Seborrheic dermatitis 11/14/2020   Down's syndrome 11/14/2020   Hyperlipidemia LDL goal <160 11/14/2020   Benign prostatic hyperplasia without lower urinary tract symptoms 11/14/2020   Encounter for general adult medical examination with abnormal findings 11/14/2020   Need for prophylactic vaccination with combined diphtheria-tetanus-pertussis (DTP) vaccine 11/14/2020    Past Surgical History:  Procedure Laterality Date   ARTHRODESIS METATARSAL Left 06/24/2017   Procedure: Left hallux metatarsal phalangeal joint arthrodesis; 2nd metatarsal Weil osteotomy;  Surgeon: Toni Arthurs, MD;  Location: [facility];  Service: Orthopedics;  Laterality: Left;   TOE SURGERY     WEIL OSTEOTOMY Left 06/24/2017   Procedure: WEIL OSTEOTOMY;  Surgeon: Toni Arthurs, MD;  Location: [facility];   Service: Orthopedics;  Laterality: Left;       Home Medications    Prior to Admission medications   Medication Sig Start Date End Date Taking? Authorizing Provider  benzonatate (TESSALON) 100 MG capsule Take 1 capsule (100 mg total) by mouth 3 (three) times daily as needed for cough. 01/21/22   Waldon Merl, PA-C  doxycycline (VIBRA-TABS) 100 MG tablet Take 1 tablet (100 mg total) by mouth 2 (two) times daily. 01/21/22   Waldon Merl, PA-C  doxycycline (VIBRA-TABS) 100 MG tablet Take 1 tablet (100 mg total) by mouth 2 (two) times daily for 10 days. 06/07/22 06/17/22  Delorse Lek, FNP    Family History Family History  Problem Relation Age of Onset   Hypothyroidism Mother    Heart disease Father    Liver disease Father    Hypothyroidism Sister    Depression Brother    GER disease Brother    Heart disease Maternal Grandmother    Heart disease Maternal Grandfather     Social History Social History   Tobacco Use   Smoking status: Never    Passive exposure: Current   Smokeless tobacco: Never  Substance Use Topics   Alcohol use: No   Drug use: No     Allergies   Vicodin [hydrocodone-acetaminophen]   Review of Systems Review of Systems  HENT:  Positive for ear pain.  Physical Exam Triage Vital Signs ED Triage Vitals [06/10/22 1255]  Enc Vitals Group     BP 106/61     Pulse Rate 70     Resp 16     Temp 98.4 F (36.9 C)     Temp Source Oral     SpO2 98 %     Weight      Height      Head Circumference      Peak Flow      Pain Score      Pain Loc      Pain Edu?      Excl. in GC?    No data found.  Updated Vital Signs BP 106/61 (BP Location: Right Arm)   Pulse 70   Temp 98.4 F (36.9 C) (Oral)   Resp 16   SpO2 98%   Visual Acuity Right Eye Distance:   Left Eye Distance:   Bilateral Distance:    Right Eye Near:   Left Eye Near:    Bilateral Near:     Physical Exam Vitals and nursing note reviewed.  Constitutional:       General: He is not in acute distress.    Appearance: Normal appearance. He is not ill-appearing.  HENT:     Head: Normocephalic and atraumatic.     Right Ear: Tympanic membrane is not erythematous.     Left Ear: There is impacted cerumen.     Ears:     Comments: Scant amount of cerumen in right canal with TM visible Eyes:     Pupils: Pupils are equal, round, and reactive to light.  Cardiovascular:     Rate and Rhythm: Normal rate.  Pulmonary:     Effort: Pulmonary effort is normal.  Skin:    General: Skin is warm and dry.  Neurological:     General: No focal deficit present.     Mental Status: He is alert and oriented to person, place, and time.  Psychiatric:        Mood and Affect: Mood normal.        Behavior: Behavior normal.      UC Treatments / Results  Labs (all labs ordered are listed, but only abnormal results are displayed) Labs Reviewed - No data to display  EKG   Radiology No results found.  Procedures Procedures (including critical care time)  Medications Ordered in UC Medications - No data to display  Initial Impression / Assessment and Plan / UC Course  I have reviewed the triage vital signs and the nursing notes.  Pertinent labs & imaging results that were available during my care of the patient were reviewed by me and considered in my medical decision making (see chart for details).     Bilateral ears irrigated by nursing staff.  Patient tolerated well.  After irrigation TM visible in canal and TM remain within normal limits without erythema Advised to avoid Q-tips and earbuds May use Debrox weekly to keep direct soft Follow-up as needed Final Clinical Impressions(s) / UC Diagnoses   Final diagnoses:  Impacted cerumen of left ear     Discharge Instructions      Avoid earbuds and Q-tips as much as possible Follow-up as needed     ED Prescriptions   None    PDMP not reviewed this encounter.   Radford Pax, NP 06/10/22  1328

## 2022-06-10 NOTE — ED Triage Notes (Signed)
Pt presents to UC w/ c/o ear fullness in left ear and pain x5 days. Pt is currently being treated for a sinus infection. Pt has hx of wax builduip in ears.

## 2022-06-10 NOTE — Discharge Instructions (Addendum)
Avoid earbuds and Q-tips as much as possible Follow-up as needed

## 2022-08-20 ENCOUNTER — Ambulatory Visit (INDEPENDENT_AMBULATORY_CARE_PROVIDER_SITE_OTHER): Payer: Medicare Other | Admitting: Internal Medicine

## 2022-08-20 ENCOUNTER — Encounter: Payer: Self-pay | Admitting: Internal Medicine

## 2022-08-20 VITALS — BP 122/64 | HR 55 | Temp 97.7°F | Resp 16 | Ht 64.0 in | Wt 121.0 lb

## 2022-08-20 DIAGNOSIS — Z1159 Encounter for screening for other viral diseases: Secondary | ICD-10-CM | POA: Insufficient documentation

## 2022-08-20 DIAGNOSIS — Z1211 Encounter for screening for malignant neoplasm of colon: Secondary | ICD-10-CM | POA: Insufficient documentation

## 2022-08-20 DIAGNOSIS — E875 Hyperkalemia: Secondary | ICD-10-CM | POA: Insufficient documentation

## 2022-08-20 DIAGNOSIS — L219 Seborrheic dermatitis, unspecified: Secondary | ICD-10-CM

## 2022-08-20 DIAGNOSIS — E785 Hyperlipidemia, unspecified: Secondary | ICD-10-CM

## 2022-08-20 DIAGNOSIS — E781 Pure hyperglyceridemia: Secondary | ICD-10-CM | POA: Insufficient documentation

## 2022-08-20 DIAGNOSIS — Z0001 Encounter for general adult medical examination with abnormal findings: Secondary | ICD-10-CM

## 2022-08-20 DIAGNOSIS — R7989 Other specified abnormal findings of blood chemistry: Secondary | ICD-10-CM

## 2022-08-20 DIAGNOSIS — Z0000 Encounter for general adult medical examination without abnormal findings: Secondary | ICD-10-CM | POA: Diagnosis not present

## 2022-08-20 DIAGNOSIS — R001 Bradycardia, unspecified: Secondary | ICD-10-CM | POA: Diagnosis not present

## 2022-08-20 DIAGNOSIS — Z125 Encounter for screening for malignant neoplasm of prostate: Secondary | ICD-10-CM

## 2022-08-20 DIAGNOSIS — Z114 Encounter for screening for human immunodeficiency virus [HIV]: Secondary | ICD-10-CM | POA: Insufficient documentation

## 2022-08-20 DIAGNOSIS — Z23 Encounter for immunization: Secondary | ICD-10-CM

## 2022-08-20 LAB — BASIC METABOLIC PANEL
BUN: 16 mg/dL (ref 6–23)
CO2: 36 mEq/L — ABNORMAL HIGH (ref 19–32)
Calcium: 10.2 mg/dL (ref 8.4–10.5)
Chloride: 102 mEq/L (ref 96–112)
Creatinine, Ser: 1.1 mg/dL (ref 0.40–1.50)
GFR: 75.04 mL/min (ref >60.00)
Glucose, Bld: 88 mg/dL (ref 70–99)
Potassium: 5.2 mEq/L — ABNORMAL HIGH (ref 3.5–5.1)
Sodium: 142 mEq/L (ref 135–145)

## 2022-08-20 LAB — CBC WITH DIFFERENTIAL/PLATELET
Basophils Absolute: 0.1 10*3/uL (ref 0.0–0.1)
Basophils Relative: 1 % (ref 0.0–3.0)
Eosinophils Absolute: 0 10*3/uL (ref 0.0–0.7)
Eosinophils Relative: 0.4 % (ref 0.0–5.0)
HCT: 45 % (ref 39.0–52.0)
Hemoglobin: 14.7 g/dL (ref 13.0–17.0)
Lymphocytes Relative: 30.8 % (ref 12.0–46.0)
Lymphs Abs: 1.9 10*3/uL (ref 0.7–4.0)
MCHC: 32.6 g/dL (ref 30.0–36.0)
MCV: 98.1 fl (ref 78.0–100.0)
Monocytes Absolute: 0.5 10*3/uL (ref 0.1–1.0)
Monocytes Relative: 8.4 % (ref 3.0–12.0)
Neutro Abs: 3.7 10*3/uL (ref 1.4–7.7)
Neutrophils Relative %: 59.4 % (ref 43.0–77.0)
Platelets: 252 10*3/uL (ref 150.0–400.0)
RBC: 4.58 Mil/uL (ref 4.22–5.81)
RDW: 14.9 % (ref 11.5–15.5)
WBC: 6.3 10*3/uL (ref 4.0–10.5)

## 2022-08-20 LAB — PSA: PSA: 0.21 ng/mL (ref 0.10–4.00)

## 2022-08-20 LAB — LIPID PANEL
Cholesterol: 170 mg/dL (ref 0–200)
HDL: 51.9 mg/dL (ref >39.00)
LDL Cholesterol: 82 mg/dL (ref 0–99)
NonHDL: 118.26
Total CHOL/HDL Ratio: 3
Triglycerides: 183 mg/dL — ABNORMAL HIGH (ref 0.0–149.0)
VLDL: 36.6 mg/dL (ref 0.0–40.0)

## 2022-08-20 LAB — HEPATIC FUNCTION PANEL
ALT: 15 U/L (ref 0–53)
AST: 21 U/L (ref 0–37)
Albumin: 4.2 g/dL (ref 3.5–5.2)
Alkaline Phosphatase: 50 U/L (ref 39–117)
Bilirubin, Direct: 0.1 mg/dL (ref 0.0–0.3)
Total Bilirubin: 0.5 mg/dL (ref 0.2–1.2)
Total Protein: 6.8 g/dL (ref 6.0–8.3)

## 2022-08-20 LAB — TSH: TSH: 4.07 u[IU]/mL (ref 0.35–5.50)

## 2022-08-20 MED ORDER — ITRACONAZOLE 100 MG PO CAPS: 200.0000 mg | ORAL_CAPSULE | Freq: Every day | ORAL | 0 refills | Status: DC

## 2022-08-20 MED ORDER — BOOSTRIX 5-2.5-18.5 LF-MCG/0.5 IM SUSP: 0.5000 mL | Freq: Once | INTRAMUSCULAR | 0 refills | Status: AC

## 2022-08-20 MED ORDER — ITRACONAZOLE 100 MG PO CAPS
200.0000 mg | ORAL_CAPSULE | Freq: Every day | ORAL | 0 refills | Status: DC
Start: 2022-08-20 — End: 2022-11-13

## 2022-08-20 MED ORDER — SHINGRIX 50 MCG/0.5ML IM SUSR
0.5000 mL | Freq: Once | INTRAMUSCULAR | 1 refills | Status: AC
Start: 2022-08-20 — End: 2022-08-20

## 2022-08-20 MED ORDER — ITRACONAZOLE 100 MG PO CAPS: 200.0000 mg | ORAL_CAPSULE | ORAL | 0 refills | Status: DC

## 2022-08-20 NOTE — Patient Instructions (Signed)
Health Maintenance, Male Adopting a healthy lifestyle and getting preventive care are important in promoting health and wellness. Ask your health care provider about: The right schedule for you to have regular tests and exams. Things you can do on your own to prevent diseases and keep yourself healthy. What should I know about diet, weight, and exercise? Eat a healthy diet  Eat a diet that includes plenty of vegetables, fruits, low-fat dairy products, and lean protein. Do not eat a lot of foods that are high in solid fats, added sugars, or sodium. Maintain a healthy weight Body mass index (BMI) is a measurement that can be used to identify possible weight problems. It estimates body fat based on height and weight. Your health care provider can help determine your BMI and help you achieve or maintain a healthy weight. Get regular exercise Get regular exercise. This is one of the most important things you can do for your health. Most adults should: Exercise for at least 150 minutes each week. The exercise should increase your heart rate and make you sweat (moderate-intensity exercise). Do strengthening exercises at least twice a week. This is in addition to the moderate-intensity exercise. Spend less time sitting. Even light physical activity can be beneficial. Watch cholesterol and blood lipids Have your blood tested for lipids and cholesterol at 56 years of age, then have this test every 5 years. You may need to have your cholesterol levels checked more often if: Your lipid or cholesterol levels are high. You are older than 56 years of age. You are at high risk for heart disease. What should I know about cancer screening? Many types of cancers can be detected early and may often be prevented. Depending on your health history and family history, you may need to have cancer screening at various ages. This may include screening for: Colorectal cancer. Prostate cancer. Skin cancer. Lung  cancer. What should I know about heart disease, diabetes, and high blood pressure? Blood pressure and heart disease High blood pressure causes heart disease and increases the risk of stroke. This is more likely to develop in people who have high blood pressure readings or are overweight. Talk with your health care provider about your target blood pressure readings. Have your blood pressure checked: Every 3-5 years if you are 18-39 years of age. Every year if you are 40 years old or older. If you are between the ages of 65 and 75 and are a current or former smoker, ask your health care provider if you should have a one-time screening for abdominal aortic aneurysm (AAA). Diabetes Have regular diabetes screenings. This checks your fasting blood sugar level. Have the screening done: Once every three years after age 45 if you are at a normal weight and have a low risk for diabetes. More often and at a younger age if you are overweight or have a high risk for diabetes. What should I know about preventing infection? Hepatitis B If you have a higher risk for hepatitis B, you should be screened for this virus. Talk with your health care provider to find out if you are at risk for hepatitis B infection. Hepatitis C Blood testing is recommended for: Everyone born from 1945 through 1965. Anyone with known risk factors for hepatitis C. Sexually transmitted infections (STIs) You should be screened each year for STIs, including gonorrhea and chlamydia, if: You are sexually active and are younger than 56 years of age. You are older than 56 years of age and your   health care provider tells you that you are at risk for this type of infection. Your sexual activity has changed since you were last screened, and you are at increased risk for chlamydia or gonorrhea. Ask your health care provider if you are at risk. Ask your health care provider about whether you are at high risk for HIV. Your health care provider  may recommend a prescription medicine to help prevent HIV infection. If you choose to take medicine to prevent HIV, you should first get tested for HIV. You should then be tested every 3 months for as long as you are taking the medicine. Follow these instructions at home: Alcohol use Do not drink alcohol if your health care provider tells you not to drink. If you drink alcohol: Limit how much you have to 0-2 drinks a day. Know how much alcohol is in your drink. In the [place] one drink equals one 12 oz bottle of beer (355 mL), one 5 oz glass of wine (148 mL), or one 1 oz glass of hard liquor (44 mL). Lifestyle Do not use any products that contain nicotine or tobacco. These products include cigarettes, chewing tobacco, and vaping devices, such as e-cigarettes. If you need help quitting, ask your health care provider. Do not use street drugs. Do not share needles. Ask your health care provider for help if you need support or information about quitting drugs. General instructions Schedule regular health, dental, and eye exams. Stay current with your vaccines. Tell your health care provider if: You often feel depressed. You have ever been abused or do not feel safe at home. Summary Adopting a healthy lifestyle and getting preventive care are important in promoting health and wellness. Follow your health care provider's instructions about healthy diet, exercising, and getting tested or screened for diseases. Follow your health care provider's instructions on monitoring your cholesterol and blood pressure. This information is not intended to replace advice given to you by your health care provider. Make sure you discuss any questions you have with your health care provider. Document Revised: 06/10/2020 Document Reviewed: 06/10/2020 Elsevier Patient Education  2024 Elsevier Inc.  

## 2022-08-20 NOTE — Progress Notes (Unsigned)
Subjective:  Patient ID: Paul Huffman, male    DOB: [birth year]  Age: 57 y.o. MRN: [id number]  CC: Annual Exam and Rash   HPI [name] presents for a CPX and f/up ----   Discussed the use of AI scribe software for clinical note transcription with the patient, who gave verbal consent to proceed.  History of Present Illness   The patient, with a history of a facial rash, has been managing well with regular application of a prescribed cream and routine shaving. He has been maintaining an active lifestyle with no complaints of dizziness, lightheadedness, or chest discomfort. He has not been screened for colon cancer previously, but is open to home-based screening methods. There is no reported family history of colon or prostate cancer.  The patient's weight has been stable at 121 pounds for the past five to six years, and he has a good appetite. He has been up-to-date with vaccinations, including flu and COVID-19, but has not yet received the shingles vaccine.  In December, the patient was on antibiotics and cough medicine, but these have since been discontinued with resolution of the cough and wheezing. There have been no new developments in the family history. His last full check-up was some time ago, prompting this visit.       Outpatient Medications Prior to Visit  Medication Sig Dispense Refill   benzonatate (TESSALON) 100 MG capsule Take 1 capsule (100 mg total) by mouth 3 (three) times daily as needed for cough. 30 capsule 0   doxycycline (VIBRA-TABS) 100 MG tablet Take 1 tablet (100 mg total) by mouth 2 (two) times daily. 14 tablet 0   No facility-administered medications prior to visit.    ROS Review of Systems  Constitutional:  Negative for diaphoresis and fatigue.  HENT:  Negative for trouble swallowing.   Eyes: Negative.   Respiratory:  Negative for cough, chest tightness, shortness of breath and wheezing.   Cardiovascular:  Negative for chest pain, palpitations and  leg swelling.  Gastrointestinal:  Negative for abdominal pain, constipation, nausea and vomiting.  Endocrine: Negative.   Genitourinary: Negative.  Negative for difficulty urinating, hematuria, scrotal swelling and testicular pain.  Musculoskeletal: Negative.   Skin:  Positive for color change and rash.       Facial rash, not improved with OTC topical steroid  Neurological: Negative.  Negative for dizziness and weakness.  Hematological:  Negative for adenopathy. Does not bruise/bleed easily.  Psychiatric/Behavioral: Negative.      Objective:  BP 122/64 (BP Location: Left Arm, Patient Position: Sitting, Cuff Size: Normal)   Pulse (!) 55   Temp 97.7 F (36.5 C) (Oral)   Resp 16   Ht 5\' 4"  (1.626 m)   Wt 121 lb (54.9 kg)   SpO2 95%   BMI 20.77 kg/m   BP Readings from Last 3 Encounters:  08/20/22 122/64  06/10/22 106/61  11/14/20 126/72    Wt Readings from Last 3 Encounters:  08/20/22 121 lb (54.9 kg)  11/14/20 128 lb (58.1 kg)  03/18/20 124 lb (56.2 kg)    Physical Exam Vitals reviewed. Exam conducted with a chaperone present (his brother).  Constitutional:      Appearance: Normal appearance.  HENT:     Nose: Nose normal.     Mouth/Throat:     Mouth: Mucous membranes are moist.  Eyes:     General: No scleral icterus.    Conjunctiva/sclera: Conjunctivae normal.  Cardiovascular:     Rate and Rhythm: Regular  rhythm. Bradycardia present.     Heart sounds: Normal heart sounds, S1 normal and S2 normal. No murmur heard.    No friction rub. No gallop.     Comments: EKG- SB, 54 bpm No LVH, Q waves, or ST/T wave changes Pulmonary:     Effort: Pulmonary effort is normal.     Breath sounds: No stridor. No wheezing, rhonchi or rales.  Abdominal:     General: Abdomen is flat.     Palpations: There is no mass.     Tenderness: There is no abdominal tenderness. There is no guarding.     Hernia: No hernia is present. There is no hernia in the left inguinal area or right  inguinal area.  Genitourinary:    Penis: Normal and circumcised.      Testes: Normal.     Epididymis:     Right: Normal.     Left: Normal.     Prostate: Normal. Not enlarged, not tender and no nodules present.     Rectum: Guaiac result negative. External hemorrhoid present. No mass, tenderness, anal fissure or internal hemorrhoid. Normal anal tone.     Comments: Atrophic testes Musculoskeletal:     Cervical back: Neck supple.  Lymphadenopathy:     Cervical: No cervical adenopathy.     Lower Body: No right inguinal adenopathy. No left inguinal adenopathy.  Skin:    General: Skin is warm and dry.     Findings: Rash present.     Comments: There are erythematous patches with greasy scale in the nasolabial folds , into the nasopharynx, over the eyebrows, the malar surfaces, and over the chin.  Neurological:     Mental Status: He is alert. Mental status is at baseline.  Psychiatric:        Mood and Affect: Mood normal.     Lab Results  Component Value Date   WBC 6.3 08/20/2022   HGB 14.7 08/20/2022   HCT 45.0 08/20/2022   PLT 252.0 08/20/2022   GLUCOSE 88 08/20/2022   CHOL 170 08/20/2022   TRIG 183.0 (H) 08/20/2022   HDL 51.90 08/20/2022   LDLCALC 82 08/20/2022   ALT 15 08/20/2022   AST 21 08/20/2022   NA 142 08/20/2022   K 5.2 No hemolysis seen (H) 08/20/2022   CL 102 08/20/2022   CREATININE 1.10 08/20/2022   BUN 16 08/20/2022   CO2 36 (H) 08/20/2022   TSH 4.07 08/20/2022   PSA 0.21 08/20/2022    No results found.  Assessment & Plan:  Encounter for screening for HIV -     HIV Antibody (routine testing w rflx); Future  Need for hepatitis C screening test -     Hepatitis C antibody; Future  Hyperglyceridemia -     Lipid panel; Future  Need for zoster vaccine -     Shingrix; Inject 0.5 mLs into the muscle once for 1 dose.  Dispense: 0.5 mL; Refill: 1  Seborrheic dermatitis -     Itraconazole; Take 2 capsules (200 mg total) by mouth daily.  Dispense: 14  capsule; Refill: 0 -     Itraconazole; Take 2 capsules (200 mg total) by mouth every 14 (fourteen) days.  Dispense: 12 capsule; Refill: 0  Screening for colon cancer -     Cologuard  Hyperlipidemia LDL goal <160- Statin is not indicated. -     Basic metabolic panel; Future -     CBC with Differential/Platelet; Future -     TSH; Future -  Hepatic function panel; Future  Prostate cancer screening -     PSA; Future  Bradycardia- Labs are normal. He is asx with this. No intervention needed. -     EKG 12-Lead -     Basic metabolic panel; Future -     CBC with Differential/Platelet; Future -     TSH; Future -     Hepatic function panel; Future  High serum bicarbonate -     Ambulatory referral to Nephrology  Hyperkalemia -     Ambulatory referral to Nephrology  Need for prophylactic vaccination with combined diphtheria-tetanus-pertussis (DTP) vaccine -     Boostrix; Inject 0.5 mLs into the muscle once for 1 dose.  Dispense: 0.5 mL; Refill: 0     Follow-up: Return in about 6 months (around 02/20/2023).  Sanda Linger, MD

## 2022-08-21 LAB — HIV ANTIBODY (ROUTINE TESTING W REFLEX): HIV 1&2 Ab, 4th Generation: NONREACTIVE

## 2022-08-21 LAB — HEPATITIS C ANTIBODY: Hepatitis C Ab: NONREACTIVE

## 2022-08-22 NOTE — Assessment & Plan Note (Signed)
Exam completed. Labs reviewed. Vaccines reviewed and updated. Cancer screenings addressed. Pt ed material was given.

## 2022-11-13 ENCOUNTER — Other Ambulatory Visit: Payer: Self-pay | Admitting: Internal Medicine

## 2022-11-13 ENCOUNTER — Encounter: Payer: Self-pay | Admitting: Internal Medicine

## 2022-11-13 DIAGNOSIS — L219 Seborrheic dermatitis, unspecified: Secondary | ICD-10-CM

## 2022-11-13 MED ORDER — CICLOPIROX OLAMINE 0.77 % EX CREA: TOPICAL_CREAM | Freq: Two times a day (BID) | CUTANEOUS | 2 refills | Status: DC

## 2022-12-07 ENCOUNTER — Other Ambulatory Visit: Payer: Self-pay

## 2022-12-07 DIAGNOSIS — L219 Seborrheic dermatitis, unspecified: Secondary | ICD-10-CM

## 2022-12-07 MED ORDER — CICLOPIROX OLAMINE 0.77 % EX CREA
TOPICAL_CREAM | Freq: Two times a day (BID) | CUTANEOUS | 2 refills | Status: AC
Start: 2022-12-07 — End: ?

## 2022-12-09 LAB — LAB REPORT - SCANNED: EGFR: 82

## 2022-12-16 ENCOUNTER — Encounter: Payer: Self-pay | Admitting: Nephrology

## 2022-12-23 ENCOUNTER — Telehealth: Payer: Medicare Other | Admitting: Family Medicine

## 2022-12-23 DIAGNOSIS — J019 Acute sinusitis, unspecified: Secondary | ICD-10-CM

## 2022-12-23 DIAGNOSIS — B9689 Other specified bacterial agents as the cause of diseases classified elsewhere: Secondary | ICD-10-CM

## 2022-12-23 MED ORDER — AMOXICILLIN-POT CLAVULANATE 875-125 MG PO TABS
1.0000 | ORAL_TABLET | Freq: Two times a day (BID) | ORAL | 0 refills | Status: AC
Start: 2022-12-23 — End: 2022-12-30

## 2022-12-23 NOTE — Progress Notes (Signed)
Virtual Visit Consent   Paul Huffman, you are scheduled for a virtual visit with a [facility] provider today. Just as with appointments in the office, your consent must be obtained to participate. Your consent will be active for this visit and any virtual visit you may have with one of our providers in the next 365 days. If you have a MyChart account, a copy of this consent can be sent to you electronically.  As this is a virtual visit, video technology does not allow for your provider to perform a traditional examination. This may limit your provider's ability to fully assess your condition. If your provider identifies any concerns that need to be evaluated in person or the need to arrange testing (such as labs, EKG, etc.), we will make arrangements to do so. Although advances in technology are sophisticated, we cannot ensure that it will always work on either your end or our end. If the connection with a video visit is poor, the visit may have to be switched to a telephone visit. With either a video or telephone visit, we are not always able to ensure that we have a secure connection.  By engaging in this virtual visit, you consent to the provision of healthcare and authorize for your insurance to be billed (if applicable) for the services provided during this visit. Depending on your insurance coverage, you may receive a charge related to this service.  I need to obtain your verbal consent now. Are you willing to proceed with your visit today? Paul Huffman has provided verbal consent on 12/23/2022 for a virtual visit (video or telephone). Freddy Finner, NP  Date: 12/23/2022 11:48 AM  Virtual Visit via Video Note   I, Freddy Finner, connected with  Paul Huffman  ([id number], [birth year]) on 12/23/22 at 11:45 AM EST by a video-enabled telemedicine application and verified that I am speaking with the correct person using two identifiers.   Spoke with [employer] legal guardian- and brother as well he  was present during the visit  Location: Patient: Virtual Visit Location Patient: Home Provider: Virtual Visit Location Provider: Home Office   I discussed the limitations of evaluation and management by telemedicine and the availability of in person appointments. The patient expressed understanding and agreed to proceed.    History of Present Illness: Paul Huffman is a 57 y.o. who identifies as a male who was assigned male at birth, and is being seen today for sinus infection  Onset was this morning with complaints of cheek pain on the left side- this is noted with previous sinus infections- milky white green mucus was noted when nose was blown. Reports it was happening for a few days prior to complaints Associated symptoms are facial and temporal pain, drainage in back of throat, feels warm but no temp taken. 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   Prostate cancer screening 08/20/2022   Screening for colon cancer 08/20/2022   Need for zoster vaccine 08/20/2022  Bradycardia 08/20/2022   High serum bicarbonate 08/20/2022   Hyperkalemia 08/20/2022   Seborrheic dermatitis 11/14/2020   Down's syndrome 11/14/2020   Hyperlipidemia LDL goal <160 11/14/2020   Benign prostatic hyperplasia without lower urinary tract symptoms 11/14/2020   Encounter for general adult medical examination with abnormal findings 11/14/2020   Need for prophylactic vaccination with combined diphtheria-tetanus-pertussis (DTP)  vaccine 11/14/2020    Allergies:  Allergies  Allergen Reactions   Vicodin [Hydrocodone-Acetaminophen] Itching and Anxiety   Medications:  Current Outpatient Medications:    ciclopirox (LOPROX) 0.77 % cream, Apply topically 2 (two) times daily., Disp: 90 g, Rfl: 2  Observations/Objective: Patient is well-developed, well-nourished in no acute distress.  Resting comfortably  at home.  Head is normocephalic, atraumatic.  No labored breathing.  Speech is clear and coherent with logical content.  Patient is alert and oriented at baseline.    Assessment and Plan:  1. Acute bacterial sinusitis   - amoxicillin-clavulanate (AUGMENTIN) 875-125 MG tablet; Take 1 tablet by mouth 2 (two) times daily for 7 days.  Dispense: 14 tablet; Refill: 0  -Take meds as prescribed -Rest -Use a cool mist humidifier especially during the winter months when heat dries out the air. - Use saline nose sprays frequently to help soothe nasal passages and promote drainage. -Saline irrigations of the nose can be very helpful if done frequently.             * 4X daily for 1 week*             * Use of a nettie pot can be helpful with this.  *Follow directions with this* *Boiled or distilled water only -stay hydrated by drinking plenty of fluids - Keep thermostat turn down low to prevent drying out sinuses - For any cough or congestion- robitussin DM or Delsym as needed - For fever or aches or pains- take tylenol or ibuprofen as directed on bottle             * for fevers greater than 101 orally you may alternate ibuprofen and tylenol every 3 hours.  If you do not improve you will need a follow up visit in person.                 Reviewed side effects, risks and benefits of medication.    Patient acknowledged agreement and understanding of the plan.   Past Medical, Surgical, Social History, Allergies, and Medications have been Reviewed.     Follow Up Instructions: I discussed the assessment and  treatment plan with the patient. The patient was provided an opportunity to ask questions and all were answered. The patient agreed with the plan and demonstrated an understanding of the instructions.  A copy of instructions were sent to the patient via MyChart unless otherwise noted below.    The patient was advised to call back or seek an in-person evaluation if the symptoms worsen or if the condition fails to improve as anticipated.    Freddy Finner, NP

## 2022-12-23 NOTE — Patient Instructions (Addendum)
Paul Huffman, thank you for joining Paul Finner, NP for today's virtual visit.  While this provider is not your primary care provider (PCP), if your PCP is located in our provider database this encounter information will be shared with them immediately following your visit.   A [facility] MyChart account gives you access to today's visit and all your visits, tests, and labs performed at [facility] " click here if you don't have a [facility] MyChart account or go to [email or web address]  Consent: (Patient) Paul Huffman provided verbal consent for this virtual visit at the beginning of the encounter.  Current Medications:  Current Outpatient Medications:    amoxicillin-clavulanate (AUGMENTIN) 875-125 MG tablet, Take 1 tablet by mouth 2 (two) times daily for 7 days., Disp: 14 tablet, Rfl: 0   ciclopirox (LOPROX) 0.77 % cream, Apply topically 2 (two) times daily., Disp: 90 g, Rfl: 2   Medications ordered in this encounter:  Meds ordered this encounter  Medications   amoxicillin-clavulanate (AUGMENTIN) 875-125 MG tablet    Sig: Take 1 tablet by mouth 2 (two) times daily for 7 days.    Dispense:  14 tablet    Refill:  0    Order Specific Question:   Supervising Provider    Answer:   Merrilee Jansky [id number]     *If you need refills on other medications prior to your next appointment, please contact your pharmacy*  Follow-Up: Call back or seek an in-person evaluation if the symptoms worsen or if the condition fails to improve as anticipated.  [facility] [phone]  Other Instructions  -Take meds as prescribed -Rest -Use a cool mist humidifier especially during the winter months when heat dries out the air. - Use saline nose sprays frequently to help soothe nasal passages and promote drainage. -Saline irrigations of the nose can be very helpful if done frequently.             * 4X daily for 1 week*             * Use of a nettie pot can be  helpful with this.  *Follow directions with this* *Boiled or distilled water only -stay hydrated by drinking plenty of fluids - Keep thermostat turn down low to prevent drying out sinuses - For any cough or congestion- robitussin DM or Delsym as needed - For fever or aches or pains- take tylenol or ibuprofen as directed on bottle             * for fevers greater than 101 orally you may alternate ibuprofen and tylenol every 3 hours.  If you do not improve you will need a follow up visit in person.                  If you have been instructed to have an in-person evaluation today at a local Urgent Care facility, please use the link below. It will take you to a list of all of our available [facility], including address, phone number and hours of operation. Please do not delay care.  [facility]  If you or a family member do not have a primary care provider, use the link below to schedule a visit and establish care. When you choose a [facility] primary care physician or advanced practice provider, you gain a long-term partner in health. Find a Primary Care Provider  Learn more about [facility]'s in-office and virtual care options:  [facility] - Get Care Now

## 2023-06-10 DIAGNOSIS — H00015 Hordeolum externum left lower eyelid: Secondary | ICD-10-CM | POA: Diagnosis not present

## 2023-06-10 DIAGNOSIS — H16002 Unspecified corneal ulcer, left eye: Secondary | ICD-10-CM | POA: Diagnosis not present

## 2023-06-14 DIAGNOSIS — H00015 Hordeolum externum left lower eyelid: Secondary | ICD-10-CM | POA: Diagnosis not present

## 2023-06-14 DIAGNOSIS — H16001 Unspecified corneal ulcer, right eye: Secondary | ICD-10-CM | POA: Diagnosis not present

## 2023-06-23 DIAGNOSIS — H00015 Hordeolum externum left lower eyelid: Secondary | ICD-10-CM | POA: Diagnosis not present

## 2023-06-23 DIAGNOSIS — H16001 Unspecified corneal ulcer, right eye: Secondary | ICD-10-CM | POA: Diagnosis not present

## 2023-07-02 DIAGNOSIS — H25813 Combined forms of age-related cataract, bilateral: Secondary | ICD-10-CM | POA: Diagnosis not present

## 2023-07-02 DIAGNOSIS — H47033 Optic nerve hypoplasia, bilateral: Secondary | ICD-10-CM | POA: Diagnosis not present

## 2023-07-02 DIAGNOSIS — H524 Presbyopia: Secondary | ICD-10-CM | POA: Diagnosis not present

## 2023-07-02 DIAGNOSIS — H04123 Dry eye syndrome of bilateral lacrimal glands: Secondary | ICD-10-CM | POA: Diagnosis not present

## 2023-07-13 DIAGNOSIS — H16043 Marginal corneal ulcer, bilateral: Secondary | ICD-10-CM | POA: Diagnosis not present

## 2023-07-19 DIAGNOSIS — H16043 Marginal corneal ulcer, bilateral: Secondary | ICD-10-CM | POA: Diagnosis not present

## 2023-08-16 ENCOUNTER — Encounter: Payer: Self-pay | Admitting: Internal Medicine

## 2023-08-22 ENCOUNTER — Other Ambulatory Visit: Payer: Self-pay | Admitting: Internal Medicine

## 2023-08-22 ENCOUNTER — Encounter: Payer: Self-pay | Admitting: Internal Medicine

## 2023-11-25 LAB — FECAL OCCULT BLOOD, IMMUNOCHEMICAL: IFOBT: NEGATIVE

## 2023-12-08 ENCOUNTER — Encounter: Payer: Self-pay | Admitting: Internal Medicine

## 2023-12-23 ENCOUNTER — Ambulatory Visit

## 2024-02-24 ENCOUNTER — Ambulatory Visit

## 2024-04-06 ENCOUNTER — Ambulatory Visit
